# Patient Record
Sex: Female | Born: 1975 | Race: Black or African American | Hispanic: No | Marital: Married | Smoking: Never smoker
Health system: Southern US, Community
[De-identification: ages and names within clinical notes are randomized; demographics above are authoritative.]

## PROBLEM LIST (undated history)

## (undated) DIAGNOSIS — D649 Anemia, unspecified: Secondary | ICD-10-CM

## (undated) DIAGNOSIS — J45909 Unspecified asthma, uncomplicated: Secondary | ICD-10-CM

## (undated) DIAGNOSIS — R87629 Unspecified abnormal cytological findings in specimens from vagina: Secondary | ICD-10-CM

## (undated) DIAGNOSIS — G43909 Migraine, unspecified, not intractable, without status migrainosus: Secondary | ICD-10-CM

## (undated) DIAGNOSIS — IMO0001 Reserved for inherently not codable concepts without codable children: Secondary | ICD-10-CM

## (undated) DIAGNOSIS — E119 Type 2 diabetes mellitus without complications: Secondary | ICD-10-CM

## (undated) DIAGNOSIS — Z531 Procedure and treatment not carried out because of patient's decision for reasons of belief and group pressure: Secondary | ICD-10-CM

## (undated) HISTORY — DX: Anemia, unspecified: D64.9

## (undated) HISTORY — DX: Migraine, unspecified, not intractable, without status migrainosus: G43.909

## (undated) HISTORY — DX: Unspecified abnormal cytological findings in specimens from vagina: R87.629

## (undated) HISTORY — DX: Unspecified asthma, uncomplicated: J45.909

## (undated) HISTORY — PX: TUBAL LIGATION: SHX77

---

## 2016-11-23 ENCOUNTER — Encounter (HOSPITAL_COMMUNITY): Payer: Self-pay

## 2016-11-23 ENCOUNTER — Emergency Department (HOSPITAL_COMMUNITY)
Admission: EM | Admit: 2016-11-23 | Discharge: 2016-11-23 | Disposition: A | Discharge status: Home / Self Care | Payer: Medicaid Other | Attending: Emergency Medicine | Admitting: Emergency Medicine

## 2016-11-23 DIAGNOSIS — G8929 Other chronic pain: Secondary | ICD-10-CM | POA: Insufficient documentation

## 2016-11-23 DIAGNOSIS — M542 Cervicalgia: Secondary | ICD-10-CM | POA: Diagnosis not present

## 2016-11-23 MED ORDER — PREDNISONE 20 MG PO TABS
60.0000 mg | ORAL_TABLET | Freq: Once | ORAL | Status: AC
  Administered 2016-11-23: 60 mg via ORAL
  Filled 2016-11-23: qty 3

## 2016-11-23 MED ORDER — LIDOCAINE 4 % EX CREA: 1.0000 "application " | TOPICAL_CREAM | CUTANEOUS | 0 refills | Status: DC | PRN

## 2016-11-23 MED ORDER — PREDNISONE 50 MG PO TABS: ORAL_TABLET | ORAL | 0 refills | Status: DC

## 2016-11-23 NOTE — ED Triage Notes (Signed)
Patient here with reported chronic right sided neck pain that she has been seen in [place] for several times. States that it burns if she turns head, denies trauma.

## 2016-11-23 NOTE — ED Notes (Signed)
Papers reviewed with patient and she verbalizes understanding.  

## 2016-11-23 NOTE — ED Provider Notes (Signed)
[facility] Provider Note   CSN: [id number] Arrival date & time: 11/23/16  1050     History   Chief Complaint Neck pain  HPI   Blood pressure 118/78, pulse 66, temperature 99.1 F (37.3 C), temperature source Oral, resp. rate 16, SpO2 100 %.  Emily Glenn is a 41 y.o. female complaining of Bilateral neck pain onset greater than 6 months ago, states that she has a burning sensation that radiates down the bilateral back into the gluteus, states she's been seen for this multiple timing given prescription for muscle relaxers which she has not found to be helpful. She denies any numbness, weakness, difficulty gripping or dropping objects. Is been taking Tylenol at home with little relief. She has an appoint with primary care in April.  History reviewed. No pertinent past medical history.  There are no active problems to display for this patient.   History reviewed. No pertinent surgical history.  OB History    No data available       Home Medications    Prior to Admission medications   Medication Sig Start Date End Date Taking? Authorizing Provider  lidocaine (LMX) 4 % cream Apply 1 application topically as needed. 11/23/16   Cashis Rill, PA-C  predniSONE (DELTASONE) 50 MG tablet Take 1 tablet daily with breakfast 11/23/16   Wynetta Emery, PA-C    Family History No family history on file.  Social History Social History  Substance Use Topics  . Smoking status: Not on file  . Smokeless tobacco: Not on file  . Alcohol use Not on file     Allergies   Patient has no known allergies.   Review of Systems Review of Systems  10 systems reviewed and found to be negative, except as noted in the HPI.   Physical Exam Updated Vital Signs BP 126/86   Pulse 66   Temp 99.1 F (37.3 C) (Oral)   Resp 16   SpO2 100%   Physical Exam  Constitutional: She is oriented to person, place, and time. She appears well-developed and well-nourished. No distress.    HENT:  Head: Normocephalic and atraumatic.  Mouth/Throat: Oropharynx is clear and moist.  Eyes: Conjunctivae and EOM are normal. Pupils are equal, round, and reactive to light.  Neck: Normal range of motion.  Spurling test is positive bilaterally, grip strength is 5 out of 5 bilaterally. Sensation intact to pinprick and light touch.  Cardiovascular: Normal rate, regular rhythm and intact distal pulses.   Pulmonary/Chest: Effort normal and breath sounds normal.  Abdominal: Soft. There is no tenderness.  Musculoskeletal: Normal range of motion.  Neurological: She is alert and oriented to person, place, and time.  Skin: She is not diaphoretic.  Psychiatric: She has a normal mood and affect.  Nursing note and vitals reviewed.    ED Treatments / Results  Labs (all labs ordered are listed, but only abnormal results are displayed) Labs Reviewed - No data to display  EKG  EKG Interpretation None       Radiology No results found.  Procedures Procedures (including critical care time)  Medications Ordered in ED Medications  predniSONE (DELTASONE) tablet 60 mg (not administered)     Initial Impression / Assessment and Plan / ED Course  I have reviewed the triage vital signs and the nursing notes.  Pertinent labs & imaging results that were available during my care of the patient were reviewed by me and considered in my medical decision making (see chart for details).  Vitals:   11/23/16 1101 11/23/16 1205  BP: 118/78 126/86  Pulse: 66   Resp: 16   Temp: 99.1 F (37.3 C)   TempSrc: Oral   SpO2: 100%     Medications  predniSONE (DELTASONE) tablet 60 mg (not administered)    Emily Glenn is 41 y.o. female presenting with Chronic neck pain greater than 6 months, burning paresthesia down the bilateral back into the gluteus. Neurologic exam is nonfocal, Spurling test positive bilaterally, likely cervical radiculopathy, patient given prednisone burst and lidocaine  ointment for comfort, advised to follow closely with primary care. No signs of cord compression. No indication for emergent imaging or neurosurgery consult.  Evaluation does not show pathology that would require ongoing emergent intervention or inpatient treatment. Pt is hemodynamically stable and mentating appropriately. Discussed findings and plan with patient/guardian, who agrees with care plan. All questions answered. Return precautions discussed and outpatient follow up given.      Final Clinical Impressions(s) / ED Diagnoses   Final diagnoses:  Chronic neck pain    New Prescriptions New Prescriptions   LIDOCAINE (LMX) 4 % CREAM    Apply 1 application topically as needed.   PREDNISONE (DELTASONE) 50 MG TABLET    Take 1 tablet daily with breakfast     Wynetta Emeryicole Boyd Litaker, PA-C 11/23/16 1233    Alvira MondayErin Schlossman, MD 11/26/16 1704

## 2016-11-23 NOTE — Discharge Instructions (Signed)
Please follow with your primary care doctor in the next 2 days for a check-up. They must obtain records for further management.  ° °Do not hesitate to return to the Emergency Department for any new, worsening or concerning symptoms.  ° °

## 2016-11-25 ENCOUNTER — Encounter (HOSPITAL_COMMUNITY): Payer: Self-pay | Admitting: *Deleted

## 2016-11-25 ENCOUNTER — Ambulatory Visit (HOSPITAL_COMMUNITY)
Admission: EM | Admit: 2016-11-25 | Discharge: 2016-11-25 | Disposition: A | Discharge status: Home / Self Care | Payer: Medicaid Other | Attending: Emergency Medicine | Admitting: Emergency Medicine

## 2016-11-25 DIAGNOSIS — J111 Influenza due to unidentified influenza virus with other respiratory manifestations: Secondary | ICD-10-CM | POA: Diagnosis not present

## 2016-11-25 MED ORDER — BENZONATATE 100 MG PO CAPS: 100.0000 mg | ORAL_CAPSULE | Freq: Three times a day (TID) | ORAL | 0 refills | Status: DC

## 2016-11-25 MED ORDER — ONDANSETRON HCL 4 MG PO TABS: 4.0000 mg | ORAL_TABLET | Freq: Three times a day (TID) | ORAL | 0 refills | Status: DC | PRN

## 2016-11-25 MED ORDER — OSELTAMIVIR PHOSPHATE 75 MG PO CAPS: 75.0000 mg | ORAL_CAPSULE | Freq: Two times a day (BID) | ORAL | 0 refills | Status: DC

## 2016-11-25 NOTE — ED Triage Notes (Signed)
Pt  Reports   Symptoms  Of  Body  Aches    Chills           sorethroat        Since  Yesterday

## 2016-11-25 NOTE — ED Provider Notes (Signed)
[facility]    CSN: [id number] Arrival date & time: 11/25/16  1855     History   Chief Complaint Chief Complaint  Patient presents with  . Fever    HPI Emily Glenn is a 41 y.o. female.   HPI She is a 41 year old woman here for evaluation of body aches, sore throat, and cough. Symptoms started abruptly yesterday morning. She reports chills, but no documented fever. She reports headache and nasal congestion as well as nausea and vomiting. She was seen in the emergency room just prior to symptom onset for neck pain and stiffness. She is taking prednisone for that.  History reviewed. No pertinent past medical history.  There are no active problems to display for this patient.   History reviewed. No pertinent surgical history.  OB History    No data available       Home Medications    Prior to Admission medications   Medication Sig Start Date End Date Taking? Authorizing Provider  benzonatate (TESSALON) 100 MG capsule Take 1 capsule (100 mg total) by mouth every 8 (eight) hours. 11/25/16   Charm RingsErin J Jadon Ressler, MD  lidocaine (LMX) 4 % cream Apply 1 application topically as needed. 11/23/16   Nicole Pisciotta, PA-C  ondansetron (ZOFRAN) 4 MG tablet Take 1 tablet (4 mg total) by mouth every 8 (eight) hours as needed for nausea or vomiting. 11/25/16   Charm RingsErin J Toussaint Golson, MD  oseltamivir (TAMIFLU) 75 MG capsule Take 1 capsule (75 mg total) by mouth every 12 (twelve) hours. 11/25/16   Charm RingsErin J Rickie Gange, MD  predniSONE (DELTASONE) 50 MG tablet Take 1 tablet daily with breakfast 11/23/16   Wynetta EmeryNicole Pisciotta, PA-C    Family History History reviewed. No pertinent family history.  Social History Social History  Substance Use Topics  . Smoking status: Not on file  . Smokeless tobacco: Not on file  . Alcohol use Not on file     Allergies   Patient has no known allergies.   Review of Systems Review of Systems As in history of present illness  Physical Exam Triage Vital Signs ED  Triage Vitals  Enc Vitals Group     BP 11/25/16 1953 129/89     Pulse Rate 11/25/16 1953 93     Resp 11/25/16 1953 17     Temp 11/25/16 1953 100 F (37.8 C)     Temp Source 11/25/16 1953 Oral     SpO2 11/25/16 1953 100 %     Weight --      Height --      Head Circumference --      Peak Flow --      Pain Score 11/25/16 2004 10     Pain Loc --      Pain Edu? --      Excl. in GC? --    No data found.   Updated Vital Signs BP 129/89 (BP Location: Left Arm)   Pulse 93   Temp 100 F (37.8 C) (Oral)   Resp 17   LMP 11/25/2016   SpO2 100%   Visual Acuity Right Eye Distance:   Left Eye Distance:   Bilateral Distance:    Right Eye Near:   Left Eye Near:    Bilateral Near:     Physical Exam  Constitutional: She is oriented to person, place, and time. She appears well-developed and well-nourished. No distress.  Appears ill  HENT:  Mouth/Throat: No oropharyngeal exudate.  Oropharynx is erythematous. Nasal mucosa mildly erythematous.  Neck: Neck supple.  Cardiovascular: Normal rate, regular rhythm and normal heart sounds.   No murmur heard. Pulmonary/Chest: Effort normal and breath sounds normal. No respiratory distress. She has no wheezes. She has no rales.  Lymphadenopathy:    She has no cervical adenopathy.  Neurological: She is alert and oriented to person, place, and time.     UC Treatments / Results  Labs (all labs ordered are listed, but only abnormal results are displayed) Labs Reviewed - No data to display  EKG  EKG Interpretation None       Radiology No results found.  Procedures Procedures (including critical care time)  Medications Ordered in UC Medications - No data to display   Initial Impression / Assessment and Plan / UC Course  I have reviewed the triage vital signs and the nursing notes.  Pertinent labs & imaging results that were available during my care of the patient were reviewed by me and considered in my medical decision making  (see chart for details).     Likely flu. Given the symptoms started 36 hours ago, will place on Tamiflu. Tessalon and Zofran for symptomatic relief. Work note provided. Follow-up as needed.  Final Clinical Impressions(s) / UC Diagnoses   Final diagnoses:  Influenza    New Prescriptions New Prescriptions   BENZONATATE (TESSALON) 100 MG CAPSULE    Take 1 capsule (100 mg total) by mouth every 8 (eight) hours.   ONDANSETRON (ZOFRAN) 4 MG TABLET    Take 1 tablet (4 mg total) by mouth every 8 (eight) hours as needed for nausea or vomiting.   OSELTAMIVIR (TAMIFLU) 75 MG CAPSULE    Take 1 capsule (75 mg total) by mouth every 12 (twelve) hours.     Charm Rings, MD 11/25/16 2022

## 2016-11-25 NOTE — Discharge Instructions (Signed)
You have the flu. Take Tamiflu twice a day for 5 days. This does not cure the flu, but shortens duration of symptoms. Use Tessalon every 8 hours as needed for cough. Use Zofran every 8 hours as needed for nausea or vomiting. Get plenty of rest and drink lots of fluids. Alternate Tylenol and ibuprofen to help with body aches and fever. You're going to feel pretty miserable for another few days before things start to get better.

## 2017-02-04 ENCOUNTER — Ambulatory Visit: Payer: Medicaid Other | Attending: Family Medicine | Admitting: Physical Therapy

## 2017-02-04 ENCOUNTER — Encounter: Payer: Self-pay | Admitting: Physical Therapy

## 2017-02-04 DIAGNOSIS — M5442 Lumbago with sciatica, left side: Secondary | ICD-10-CM | POA: Diagnosis present

## 2017-02-04 DIAGNOSIS — M62838 Other muscle spasm: Secondary | ICD-10-CM | POA: Diagnosis present

## 2017-02-04 DIAGNOSIS — M542 Cervicalgia: Secondary | ICD-10-CM | POA: Diagnosis not present

## 2017-02-04 NOTE — Therapy (Addendum)
[facility] [address] [place], [place], [address] Phone: [phone]   Fax:  [phone]  Physical Therapy Evaluation  Patient Details  Name: Emily Glenn MRN: [id number] Date of Birth: August 31, 1976 Referring Provider: Zoe Lan  Encounter Date: 02/04/2017      PT End of Session - 02/04/17 1542    Visit Number 1   Date for PT Re-Evaluation 04/06/17   PT Start Time 1516   PT Stop Time 1610   PT Time Calculation (min) 54 min   Activity Tolerance Patient tolerated treatment well   Behavior During Therapy [facility] for tasks assessed/performed      History reviewed. No pertinent past medical history.  History reviewed. No pertinent surgical history.  There were no vitals filed for this visit.       Subjective Assessment - 02/04/17 1516    Subjective Patient presents with neck and left sciatic pain.  She reports that she has had some pain for over a year.  She reports that she has been diagnosed with scoliosis, she reports that an MD in the past has spoken with her about breast reduction surgery due to the neck, upper back and HA's.  Reports that she has HA's daily.  She does have the left side sciatica.  X-rays otherwise were negative.   Limitations Sitting;Lifting;Walking;Standing;House hold activities   Patient Stated Goals have less pain and HA's   Currently in Pain? Yes   Pain Score 7    Pain Location Back   Pain Orientation Upper;Mid;Lower   Pain Descriptors / Indicators Aching;Tightness;Spasm   Pain Type Chronic pain   Pain Onset More than a month ago   Pain Frequency Constant   Aggravating Factors  bending and lifting, worse as the day goes on, up to 9-10/10   Pain Relieving Factors staying moving, heat, at best 2/10   Effect of Pain on Daily Activities just miserable at times, has a HA daily            [facility] PT Assessment - 02/04/17 0001      Assessment   Medical Diagnosis neck pain, HA and sciatica    Referring Provider Zoe Lan   Onset Date/Surgical Date 01/04/17   Hand Dominance Right   Prior Therapy none     Precautions   Precautions None     Balance Screen   Has the patient fallen in the past 6 months No   Has the patient had a decrease in activity level because of a fear of falling?  No   Is the patient reluctant to leave their home because of a fear of falling?  No     Home Environment   Additional Comments patient has a 69 and 42 year old.  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   Leisure no exercise     Sensation   Additional Comments does c/o tingling in the hands and feet     Posture/Postural Control   Posture Comments large busted, straight back posture     ROM / Strength   AROM / PROM / Strength AROM;Strength     AROM   Overall AROM Comments Lumbar ROM was decreased 75% for all motions with pain, cervcial ROM decreased 50% for flexion, extension and side bending, rotaiton WNL's, this increased pain in the neck, Shoulder AROM  flexion/abduction to 100 degrees, ER and IR WNL's     Strength   Overall Strength Comments 4-/5 in the shoulders and Hips with c/o increased pain     Flexibility   Soft Tissue Assessment /Muscle Length --  has neural tension in the UE and LE's     Palpation   Palpation comment has tightness iwth spasms in the upper trap and the cervical and lumbar parapsinals, tender in the left SI and buttock                   [facility] Adult PT Treatment/Exercise - 02/04/17 0001      Modalities   Modalities Electrical Stimulation;Moist Heat     Moist Heat Therapy   Number Minutes Moist Heat 15 Minutes   Moist Heat Location Cervical;Lumbar Spine     Electrical Stimulation   Electrical Stimulation Location left SI/buttock   Electrical Stimulation Action IFC   Electrical Stimulation Parameters supine    Electrical Stimulation Goals Pain                PT Education - 02/04/17 1542    Education provided Yes   Education Details gentle cervical motions, UE and LE stretches   Person(s) Educated Patient   Methods Explanation;Demonstration   Comprehension Verbalized understanding          PT Short Term Goals - 02/04/17 1546      PT SHORT TERM GOAL #1   Title independent with initial HEP   Time 2   Period Weeks   Status New           PT Long Term Goals - 02/04/17 1546      PT LONG TERM GOAL #1   Title understand proper posture and body mechanics   Time 8   Period Weeks   Status New     PT LONG TERM GOAL #2   Title decrease pain 50%   Time 8   Period Weeks   Status New     PT LONG TERM GOAL #3   Title increase ROM 50%   Time 8   Period Weeks   Status New     PT LONG TERM GOAL #4   Title report HA frequency decreased 25%   Time 8   Period Weeks   Status New               Plan - 02/04/17 1543    Clinical Impression Statement Patient reports a history of neck pain, HA's and some left sciatica greater than a year.  She mentioned having large breasts and that an MD in the past had recommended breast reduction surgery, she reports that she has been diagnosed with scoliosis.  She has tenderness in the left SI/buttock, she has neural tension in the UE and LE's.  She has lumbar ROM that is limited 50%   Rehab Potential Good   PT Frequency 2x / week   PT Duration 8 weeks   PT Treatment/Interventions ADLs/Self Care Home Management;Cryotherapy;Electrical Stimulation;Moist Heat;Traction;Ultrasound;Patient/family education;Neuromuscular re-education;Balance training;Therapeutic exercise;Therapeutic activities;Manual techniques;Taping   PT Next Visit Plan get patient moving in the gym and talk about proper posture and body mechanics with her job   Consulted and Agree with Plan of Care Patient      Patient will benefit from skilled therapeutic intervention  in order to improve the following deficits and impairments:  Decreased strength, Postural dysfunction, Improper body mechanics, Impaired flexibility, Pain, Increased muscle spasms, Decreased range of motion, Difficulty walking  Visit Diagnosis: Cervicalgia - Plan: PT plan of care cert/re-cert  Other muscle spasm - Plan: PT plan of care cert/re-cert  Acute left-sided low back pain with left-sided sciatica - Plan: PT plan of care cert/re-cert  Patient only has Medicaid which only allows one evaluation and one visit per year.  Next visit would need to give thorough HEP, I gave TENS info, if she were to get this would need to instruct in in its use   Problem List There are no active problems to display for this patient.   Jearld Lesch., PT 02/04/2017, 3:49 PM  [facility] [address] [place], [place], [address] Phone: [phone]   Fax:  [phone]  Name: Emily Glenn MRN: [id number] Date of Birth: [birth year]

## 2017-02-19 ENCOUNTER — Encounter: Payer: Self-pay | Admitting: Physical Therapy

## 2017-02-19 ENCOUNTER — Ambulatory Visit: Payer: Medicaid Other | Admitting: Physical Therapy

## 2017-02-19 DIAGNOSIS — M62838 Other muscle spasm: Secondary | ICD-10-CM

## 2017-02-19 DIAGNOSIS — M5442 Lumbago with sciatica, left side: Secondary | ICD-10-CM

## 2017-02-19 DIAGNOSIS — M542 Cervicalgia: Secondary | ICD-10-CM

## 2017-02-19 NOTE — Therapy (Addendum)
[facility] [address] [place], [place], [address] Phone: [phone]   Fax:  [phone]  Physical Therapy Treatment  Patient Details  Name: Emily Glenn MRN: [id number] Date of Birth: 05/17/1976 Referring Provider: Eldridge Abrahams  Encounter Date: 02/19/2017      PT End of Session - 02/19/17 1649    Visit Number 2   Date for PT Re-Evaluation 04/06/17   PT Start Time 1224   PT Stop Time 1658   PT Time Calculation (min) 57 min   Activity Tolerance Patient tolerated treatment well   Behavior During Therapy [facility] for tasks assessed/performed      History reviewed. No pertinent past medical history.  History reviewed. No pertinent surgical history.  There were no vitals filed for this visit.      Subjective Assessment - 02/19/17 1602    Subjective Pt. states that she is having neck and back pain today rated at an 8. Stated that she almost went to the emergency room last night and right before todays appointment due to pain. Reported that pain last night was right side low back. Pt stated that she took some pain meds this afternoon. Reports that she has headaches every day.    Limitations Sitting;Lifting;Walking;Standing;House hold activities   Patient Stated Goals have less pain and HA's   Pain Score 8    Pain Location Back  neck                         [facility] Adult PT Treatment/Exercise - 02/19/17 0001      Exercises   Exercises Neck;Lumbar     Neck Exercises: Seated   Shoulder Shrugs 10 reps  2 sets, no weight    Other Seated Exercise Shoulder ER, [place]ulder horizontal abduction 2x10 with red theraband      Lumbar Exercises: Stretches   Passive Hamstring Stretch 3 reps;20 seconds   Lower Trunk Rotation 3 reps;20 seconds     Lumbar Exercises: Aerobic   UBE (Upper Arm Bike) [id number]     Lumbar Exercises: Standing   Row 10 reps  2 sets   Theraband Level (Row) Level 2 (Red)   Shoulder Extension 10 reps  2 sets   Theraband Level (Shoulder Extension) Level 2 (Red)   Other Standing Lumbar Exercises standing back extension 2x10 with Bink ball, standing trunk rotation with red ball 2x10   Other Standing Lumbar Exercises calf stretch 3x20 secs     Lumbar Exercises: Supine   Bridge 10 reps;2 seconds   Bridge Limitations 2 sets     Modalities   Modalities Electrical Stimulation;Moist Heat     Moist Heat Therapy   Number Minutes Moist Heat 15 Minutes   Moist Heat Location Cervical;Lumbar Spine     Electrical Stimulation   Electrical Stimulation Location left SI/buttock   Electrical Stimulation Action IFC    Electrical Stimulation Parameters supine   Electrical Stimulation Goals Pain                  PT Short Term Goals - 02/04/17 1546      PT SHORT TERM GOAL #1   Title independent with initial HEP   Time 2   Period Weeks   Status New           PT Long Term Goals - 02/04/17 1546      PT LONG TERM GOAL #1   Title understand proper posture and  body mechanics   Time 8   Period Weeks   Status New     PT LONG TERM GOAL #2   Title decrease pain 50%   Time 8   Period Weeks   Status New     PT LONG TERM GOAL #3   Title increase ROM 50%   Time 8   Period Weeks   Status New     PT LONG TERM GOAL #4   Title report HA frequency decreased 25%   Time 8   Period Weeks   Status New               Plan - 02/19/17 1650    Clinical Impression Statement Pt was able to complete all exercises today. No complaints of increased pain or tightness in back with exercises. Tolerated stretches but has more tightness in L HS compared to R. Complaints of increased tightness in neck during rows/shoulder extensions/horizontal shoulder abduction. 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 Pt reported decreased pain after treatment today. Issued HEP to pt.    Rehab Potential Good   PT Frequency 2x / week    PT Duration 8 weeks   PT Treatment/Interventions ADLs/Self Care Home Management;Cryotherapy;Electrical Stimulation;Moist Heat;Traction;Ultrasound;Patient/family education;Neuromuscular re-education;Balance training;Therapeutic exercise;Therapeutic activities;Manual techniques;Taping   PT Next Visit Plan DC pt due to number of visits   Consulted and Agree with Plan of Care Patient      Patient will benefit from skilled therapeutic intervention in order to improve the following deficits and impairments:  Decreased strength, Postural dysfunction, Improper body mechanics, Impaired flexibility, Pain, Increased muscle spasms, Decreased range of motion, Difficulty walking  Visit Diagnosis: Cervicalgia  Other muscle spasm  Acute left-sided low back pain with left-sided sciatica     Problem List There are no active problems to display for this patient.  PHYSICAL THERAPY DISCHARGE SUMMARY  Visits from Start of Care: 2  Plan: Patient agrees to discharge.  Patient goals were not met. Patient is being discharged due to not returning since the last visit.  ?????      Octavia Bruckner 02/19/2017, 4:58 PM  [facility] [address] [place], [place], [address] Phone: [phone]   Fax:  [phone]  Name: Emily Glenn MRN: [id number] Date of Birth: [birth year]

## 2017-11-13 ENCOUNTER — Emergency Department (HOSPITAL_COMMUNITY)
Admission: EM | Admit: 2017-11-13 | Discharge: 2017-11-13 | Disposition: A | Discharge status: Home / Self Care | Payer: Medicaid Other | Attending: Emergency Medicine | Admitting: Emergency Medicine

## 2017-11-13 ENCOUNTER — Encounter (HOSPITAL_COMMUNITY): Payer: Self-pay

## 2017-11-13 ENCOUNTER — Emergency Department (HOSPITAL_COMMUNITY): Payer: Medicaid Other

## 2017-11-13 ENCOUNTER — Other Ambulatory Visit: Payer: Self-pay

## 2017-11-13 DIAGNOSIS — E119 Type 2 diabetes mellitus without complications: Secondary | ICD-10-CM | POA: Diagnosis not present

## 2017-11-13 DIAGNOSIS — R05 Cough: Secondary | ICD-10-CM | POA: Diagnosis present

## 2017-11-13 DIAGNOSIS — B9789 Other viral agents as the cause of diseases classified elsewhere: Secondary | ICD-10-CM | POA: Insufficient documentation

## 2017-11-13 DIAGNOSIS — R059 Cough, unspecified: Secondary | ICD-10-CM

## 2017-11-13 DIAGNOSIS — J069 Acute upper respiratory infection, unspecified: Secondary | ICD-10-CM | POA: Diagnosis not present

## 2017-11-13 HISTORY — DX: Reserved for inherently not codable concepts without codable children: IMO0001

## 2017-11-13 HISTORY — DX: Procedure and treatment not carried out because of patient's decision for reasons of belief and group pressure: Z53.1

## 2017-11-13 LAB — RAPID STREP SCREEN (MED CTR MEBANE ONLY): STREPTOCOCCUS, GROUP A SCREEN (DIRECT): NEGATIVE

## 2017-11-13 MED ORDER — IBUPROFEN 800 MG PO TABS
800.0000 mg | ORAL_TABLET | Freq: Once | ORAL | Status: AC
Start: 2017-11-13 — End: 2017-11-13
  Administered 2017-11-13: 800 mg via ORAL
  Filled 2017-11-13: qty 1

## 2017-11-13 NOTE — ED Provider Notes (Signed)
[facility] EMERGENCY DEPARTMENT Provider Note   CSN: [id number] Arrival date & time: 11/13/17  1413     History   Chief Complaint Chief Complaint  Patient presents with  . URI    HPI Emily Glenn is a 42 y.o. female.  The history is provided by the patient and medical records. No language interpreter was used.  URI   Associated symptoms include congestion, sinus pain, sore throat and cough. Pertinent negatives include no chest pain, no abdominal pain, no diarrhea, no nausea, no vomiting, no headaches and no rash.     Emily Glenn is a 42 y.o. female  with a PMH of DM who presents to the Emergency Department complaining of progressively worsening sore throat, facial pain, generalized body aches, productive cough and chills which began yesterday.  She endorses feeling feverish, but has not taken temperature. She has taken Tylenol with some improvement. No known sick contacts. Did not get flu shot.   Past Medical History:  Diagnosis Date  . Refusal of blood transfusions as patient is Jehovah's Witness     There are no active problems to display for this patient.   History reviewed. No pertinent surgical history.  OB History    No data available       Home Medications    Prior to Admission medications   Medication Sig Start Date End Date Taking? Authorizing Provider  benzonatate (TESSALON) 100 MG capsule Take 1 capsule (100 mg total) by mouth every 8 (eight) hours. 11/25/16   Charm Rings, MD  lidocaine (LMX) 4 % cream Apply 1 application topically as needed. 11/23/16   Pisciotta, Joni Reining, PA-C  ondansetron (ZOFRAN) 4 MG tablet Take 1 tablet (4 mg total) by mouth every 8 (eight) hours as needed for nausea or vomiting. 11/25/16   Charm Rings, MD  oseltamivir (TAMIFLU) 75 MG capsule Take 1 capsule (75 mg total) by mouth every 12 (twelve) hours. 11/25/16   Charm Rings, MD  predniSONE (DELTASONE) 50 MG tablet Take 1 tablet daily with breakfast 11/23/16    Pisciotta, Mardella Layman    Family History History reviewed. No pertinent family history.  Social History Social History   Tobacco Use  . Smoking status: Never Smoker  . Smokeless tobacco: Never Used  Substance Use Topics  . Alcohol use: No    Frequency: Never  . Drug use: No     Allergies   Patient has no known allergies.   Review of Systems Review of Systems  Constitutional: Positive for chills. Negative for fever.  HENT: Positive for congestion, sinus pressure, sinus pain and sore throat.   Respiratory: Positive for cough. Negative for shortness of breath.   Cardiovascular: Negative for chest pain.  Gastrointestinal: Negative for abdominal pain, diarrhea, nausea and vomiting.  Musculoskeletal: Positive for myalgias.  Skin: Negative for rash.  Neurological: Negative for weakness, light-headedness and headaches.     Physical Exam Updated Vital Signs BP 116/83 (BP Location: Right Arm)   Pulse 86   Temp 98.4 F (36.9 C) (Oral)   Resp 16   Ht 5\' 5"  (1.651 m)   Wt 77.1 kg (170 lb)   LMP 11/11/2017 (Exact Date)   SpO2 100%   BMI 28.29 kg/m   Physical Exam  Constitutional: She is oriented to person, place, and time. She appears well-developed and well-nourished. No distress.  HENT:  Head: Normocephalic and atraumatic.  OP with erythema, no exudates or tonsillar hypertrophy. + nasal congestion with mucosal edema.  Neck: Normal range of motion. Neck supple.  No meningeal signs.   Cardiovascular: Normal rate, regular rhythm and normal heart sounds.  Pulmonary/Chest: Effort normal.  Lungs are clear to auscultation bilaterally - no w/r/r  Abdominal: Soft. She exhibits no distension. There is no tenderness.  Musculoskeletal: Normal range of motion.  Neurological: She is alert and oriented to person, place, and time.  Skin: Skin is warm and dry. She is not diaphoretic.  Nursing note and vitals reviewed.    ED Treatments / Results  Labs (all labs ordered are  listed, but only abnormal results are displayed) Labs Reviewed  RAPID STRE[facility]CREEN ([facility])  CULTURE, GROUP A STREP [facility])    EKG  EKG Interpretation None       Radiology Dg Chest 2 View  Result Date: 11/13/2017 CLINICAL DATA:  Productive cough and shortness of breath for 1 day. EXAM: CHEST  2 VIEW COMPARISON:  None. FINDINGS: The heart size and mediastinal contours are within normal limits. Both lungs are clear. The visualized skeletal structures are unremarkable. IMPRESSION: No active cardiopulmonary disease. Electronically Signed   By: Sherian ReinWei-Chen  Lin M.D.   On: 11/13/2017 18:32    Procedures Procedures (including critical care time)  Medications Ordered in ED Medications  ibuprofen (ADVIL,MOTRIN) tablet 800 mg (800 mg Oral Given 11/13/17 1806)     Initial Impression / Assessment and Plan / ED Course  I have reviewed the triage vital signs and the nursing notes.  Pertinent labs & imaging results that were available during my care of the patient were reviewed by me and considered in my medical decision making (see chart for details).    Nila Nephewracy Lasecki is a 42 y.o. female who presents to ED for multiple upper respiratory symptoms including productive cough, sore throat, sinus pressure, generalized body aches, chills.   On exam, patient is afebrile, non-toxic appearing with a clear lung exam. Mild rhinorrhea and OP with erythema but no exudates or tonsillar hypertrophy.  CXR negative. Rapid strep negative.  Sxs today likely due to viral URI, flu possible. Symptomatic home care instructions discussed.  PCP follow up strongly encouraged if symptoms persist. Reasons to return to ER discussed. All questions answered.   Blood pressure 116/83, pulse 86, temperature 98.4 F (36.9 C), temperature source Oral, resp. rate 16, height 5\' 5"  (1.651 m), weight 77.1 kg (170 lb), last menstrual period 11/11/2017, SpO2 100 %.   Final Clinical Impressions(s) / ED Diagnoses   Final  diagnoses:  Cough  Viral URI with cough    ED Discharge Orders    None       Ward, Chase PicketJaime Pilcher, PA-C 11/13/17 1845    Eber HongMiller, Brian, MD 11/14/17 1453

## 2017-11-13 NOTE — Discharge Instructions (Signed)
It was my pleasure taking care of you today!   Your symptoms are likely due to a viral upper respiratory infection. Fortunately, we did not see evidence of serious infection and can treat your symptoms. Flonase and mucinex for nasal congestion. Alternate between Tylenol and ibuprofen as needed for body aches / fevers.   Rest, drink plenty of fluids to be sure you are staying hydrated.   Please follow up with your primary doctor for discussion of your diagnoses and further evaluation after today's visit if symptoms persist longer than 7 days; Return to the ER for high fevers, difficulty breathing or other concerning symptoms

## 2017-11-13 NOTE — ED Notes (Signed)
Patient transported to X-ray 

## 2017-11-13 NOTE — ED Triage Notes (Signed)
Pt states she has had nasal congestion, facial pain, and sore throat that began last night. States she was running a fever at home but was able to use medication. Afebrile in triage, vital signs stable.

## 2017-11-15 LAB — CULTURE, GROUP A STREP (THRC)

## 2018-04-19 DIAGNOSIS — M25512 Pain in left shoulder: Secondary | ICD-10-CM | POA: Diagnosis present

## 2018-04-19 DIAGNOSIS — M7542 Impingement syndrome of left shoulder: Secondary | ICD-10-CM | POA: Insufficient documentation

## 2018-04-20 ENCOUNTER — Other Ambulatory Visit: Payer: Self-pay

## 2018-04-20 ENCOUNTER — Emergency Department (HOSPITAL_COMMUNITY)
Admission: EM | Admit: 2018-04-20 | Discharge: 2018-04-20 | Disposition: A | Discharge status: Home / Self Care | Payer: Medicaid Other | Attending: Emergency Medicine | Admitting: Emergency Medicine

## 2018-04-20 ENCOUNTER — Encounter (HOSPITAL_COMMUNITY): Payer: Self-pay | Admitting: Emergency Medicine

## 2018-04-20 ENCOUNTER — Emergency Department (HOSPITAL_COMMUNITY): Payer: Medicaid Other

## 2018-04-20 DIAGNOSIS — M7542 Impingement syndrome of left shoulder: Secondary | ICD-10-CM

## 2018-04-20 MED ORDER — IBUPROFEN 800 MG PO TABS: 800.0000 mg | ORAL_TABLET | Freq: Three times a day (TID) | ORAL | 0 refills | Status: DC

## 2018-04-20 NOTE — ED Provider Notes (Signed)
[facility]-EMERGENCY DEPT Provider Note   CSN: [id number] Arrival date & time: 04/19/18  2255     History   Chief Complaint Chief Complaint  Patient presents with  . Shoulder Pain    left    HPI Emily Glenn is a 42 y.o. female.  Patient presents to the emergency department with chief complaint of left shoulder pain.  She reports ongoing pain for the past couple of weeks.  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  She has increased pain with overhead movements especially.  She denies any trauma or known injury.  She has not taken anything for symptoms.  She denies numbness or weakness.  She states that the pain radiates down her left arm.  The history is provided by the patient. No language interpreter was used.    Past Medical History:  Diagnosis Date  . Refusal of blood transfusions as patient is Jehovah's Witness     There are no active problems to display for this patient.   History reviewed. No pertinent surgical history.   OB History   None      Home Medications    Prior to Admission medications   Medication Sig Start Date End Date Taking? Authorizing Provider  acetaminophen (TYLENOL) 500 MG tablet Take 500 mg by mouth every 6 (six) hours as needed for moderate pain.   Yes [provider]  benzonatate (TESSALON) 100 MG capsule Take 1 capsule (100 mg total) by mouth every 8 (eight) hours. Patient not taking: Reported on 04/19/2018 11/25/16   Charm Rings, MD  ibuprofen (ADVIL,MOTRIN) 800 MG tablet Take 1 tablet (800 mg total) by mouth 3 (three) times daily. 04/20/18   Roxy Horseman, PA-C  lidocaine (LMX) 4 % cream Apply 1 application topically as needed. Patient not taking: Reported on 04/19/2018 11/23/16   Pisciotta, Joni Reining, PA-C  ondansetron (ZOFRAN) 4 MG tablet Take 1 tablet (4 mg total) by mouth every 8 (eight) hours as needed for nausea or vomiting. Patient not taking: Reported on 04/19/2018 11/25/16   Charm Rings,  MD  oseltamivir (TAMIFLU) 75 MG capsule Take 1 capsule (75 mg total) by mouth every 12 (twelve) hours. Patient not taking: Reported on 04/19/2018 11/25/16   Charm Rings, MD  predniSONE (DELTASONE) 50 MG tablet Take 1 tablet daily with breakfast Patient not taking: Reported on 04/19/2018 11/23/16   Pisciotta, Joni Reining, PA-C    Family History No family history on file.  Social History Social History   Tobacco Use  . Smoking status: Never Smoker  . Smokeless tobacco: Never Used  Substance Use Topics  . Alcohol use: No    Frequency: Never  . Drug use: No     Allergies   Patient has no known allergies.   Review of Systems Review of Systems  All other systems reviewed and are negative.    Physical Exam Updated Vital Signs BP 104/74 (BP Location: Left Arm)   Pulse 78   Temp 98.3 F (36.8 C) (Oral)   Resp 15   Ht 5\' 5"  (1.651 m)   Wt 81.6 kg (180 lb)   LMP 03/30/2018   SpO2 99%   BMI 29.95 kg/m   Physical Exam  Constitutional: She is oriented to person, place, and time. She appears well-developed and well-nourished.  HENT:  Head: Normocephalic and atraumatic.  Eyes: Pupils are equal, round, and reactive to light. Conjunctivae and EOM are normal.  Neck: Normal range of motion. Neck supple.  Cardiovascular: Normal rate and regular rhythm. Exam reveals no gallop and no friction rub.  No murmur heard. Pulmonary/Chest: Effort normal and breath sounds normal. No respiratory distress. She has no wheezes. She has no rales. She exhibits no tenderness.  Abdominal: Soft. Bowel sounds are normal. She exhibits no distension and no mass. There is no tenderness. There is no rebound and no guarding.  Musculoskeletal: Normal range of motion. She exhibits no edema or tenderness.  Positive Hawkins-Kennedy, negative empty can No bony abnormality or deformity Range of motion and strength 5/5, but obviously painful with greater than 30 degrees abduction or flexion  Neurological: She is  alert and oriented to person, place, and time.  Skin: Skin is warm and dry.  Psychiatric: She has a normal mood and affect. Her behavior is normal. Judgment and thought content normal.  Nursing note and vitals reviewed.    ED Treatments / Results  Labs (all labs ordered are listed, but only abnormal results are displayed) Labs Reviewed - No data to display  EKG None  Radiology Dg Shoulder Left  Result Date: 04/20/2018 CLINICAL DATA:  Left shoulder pain x2 weeks. EXAM: LEFT SHOULDER - 2+ VIEW COMPARISON:  None. FINDINGS: There is no evidence of fracture or dislocation. There is no evidence of arthropathy or other focal bone abnormality. Soft tissues are unremarkable. IMPRESSION: No fracture or malalignment. Electronically Signed   By: Tollie Ethavid  Kwon M.D.   On: 04/20/2018 00:52    Procedures Procedures (including critical care time)  Medications Ordered in ED Medications - No data to display   Initial Impression / Assessment and Plan / ED Course  I have reviewed the triage vital signs and the nursing notes.  Pertinent labs & imaging results that were available during my care of the patient were reviewed by me and considered in my medical decision making (see chart for details).     Patient with left shoulder pain.  Plain films are negative.  Symptoms and physical exam are consistent with impingement syndrome.  Recommend orthopedic and physical therapy follow-up.  Final Clinical Impressions(s) / ED Diagnoses   Final diagnoses:  Impingement syndrome of left shoulder    ED Discharge Orders        Ordered    ibuprofen (ADVIL,MOTRIN) 800 MG tablet  3 times daily     04/20/18 0108       Roxy HorsemanBrowning, Arilynn Blakeney, PA-C 04/20/18 0111    Molpus, Jonny RuizJohn, MD 04/20/18 0236

## 2018-04-20 NOTE — ED Triage Notes (Signed)
Pt complaining of left shoulder pain with unknown origin x2 weeks. Pt states she does not remember injuring the shoulder at any time but that she woke up with sharp shooting pain a couple weeks ago and it has not gotten better.

## 2018-06-13 ENCOUNTER — Emergency Department (HOSPITAL_COMMUNITY)
Admission: EM | Admit: 2018-06-13 | Discharge: 2018-06-13 | Disposition: A | Discharge status: Home / Self Care | Payer: Medicaid Other | Attending: Emergency Medicine | Admitting: Emergency Medicine

## 2018-06-13 ENCOUNTER — Emergency Department (HOSPITAL_COMMUNITY): Payer: Medicaid Other

## 2018-06-13 ENCOUNTER — Encounter (HOSPITAL_COMMUNITY): Payer: Self-pay | Admitting: *Deleted

## 2018-06-13 DIAGNOSIS — J45909 Unspecified asthma, uncomplicated: Secondary | ICD-10-CM | POA: Insufficient documentation

## 2018-06-13 DIAGNOSIS — R112 Nausea with vomiting, unspecified: Secondary | ICD-10-CM

## 2018-06-13 DIAGNOSIS — R1084 Generalized abdominal pain: Secondary | ICD-10-CM

## 2018-06-13 DIAGNOSIS — K529 Noninfective gastroenteritis and colitis, unspecified: Secondary | ICD-10-CM | POA: Diagnosis not present

## 2018-06-13 DIAGNOSIS — R197 Diarrhea, unspecified: Secondary | ICD-10-CM

## 2018-06-13 LAB — URINALYSIS, ROUTINE W REFLEX MICROSCOPIC
BILIRUBIN URINE: NEGATIVE
GLUCOSE, UA: NEGATIVE mg/dL
Ketones, ur: NEGATIVE mg/dL
Nitrite: NEGATIVE
PH: 7 (ref 5.0–8.0)
PROTEIN: NEGATIVE mg/dL
SPECIFIC GRAVITY, URINE: 1.009 (ref 1.005–1.030)

## 2018-06-13 LAB — COMPREHENSIVE METABOLIC PANEL
ALT: 17 U/L (ref 0–44)
AST: 26 U/L (ref 15–41)
Albumin: 3.6 g/dL (ref 3.5–5.0)
Alkaline Phosphatase: 84 U/L (ref 38–126)
Anion gap: 8 (ref 5–15)
BUN: 10 mg/dL (ref 6–20)
CHLORIDE: 105 mmol/L (ref 98–111)
CO2: 27 mmol/L (ref 22–32)
CREATININE: 0.9 mg/dL (ref 0.44–1.00)
Calcium: 9.1 mg/dL (ref 8.9–10.3)
Glucose, Bld: 106 mg/dL — ABNORMAL HIGH (ref 70–99)
Potassium: 3.8 mmol/L (ref 3.5–5.1)
Sodium: 140 mmol/L (ref 135–145)
Total Bilirubin: 0.4 mg/dL (ref 0.3–1.2)
Total Protein: 7.5 g/dL (ref 6.5–8.1)

## 2018-06-13 LAB — CBC WITH DIFFERENTIAL/PLATELET
Basophils Absolute: 0 10*3/uL (ref 0.0–0.1)
Basophils Relative: 0 %
EOS PCT: 5 %
Eosinophils Absolute: 0.2 10*3/uL (ref 0.0–0.7)
HCT: 39 % (ref 36.0–46.0)
Hemoglobin: 12.5 g/dL (ref 12.0–15.0)
LYMPHS PCT: 38 %
Lymphs Abs: 1.7 10*3/uL (ref 0.7–4.0)
MCH: 27.7 pg (ref 26.0–34.0)
MCHC: 32.1 g/dL (ref 30.0–36.0)
MCV: 86.3 fL (ref 78.0–100.0)
MONO ABS: 0.7 10*3/uL (ref 0.1–1.0)
Monocytes Relative: 15 %
Neutro Abs: 1.9 10*3/uL (ref 1.7–7.7)
Neutrophils Relative %: 42 %
PLATELETS: 265 10*3/uL (ref 150–400)
RBC: 4.52 MIL/uL (ref 3.87–5.11)
RDW: 14.5 % (ref 11.5–15.5)
WBC: 4.6 10*3/uL (ref 4.0–10.5)

## 2018-06-13 LAB — I-STAT BETA HCG BLOOD, ED (MC, WL, AP ONLY)

## 2018-06-13 LAB — LIPASE, BLOOD: Lipase: 31 U/L (ref 11–51)

## 2018-06-13 MED ORDER — NAPROXEN 500 MG PO TABS: 500.0000 mg | ORAL_TABLET | Freq: Two times a day (BID) | ORAL | 0 refills | Status: DC | PRN

## 2018-06-13 MED ORDER — HYDROCODONE-ACETAMINOPHEN 5-325 MG PO TABS
1.0000 | ORAL_TABLET | Freq: Once | ORAL | Status: AC
  Administered 2018-06-13: 1 via ORAL
  Filled 2018-06-13: qty 1

## 2018-06-13 MED ORDER — ONDANSETRON HCL 4 MG/2ML IJ SOLN
4.0000 mg | Freq: Once | INTRAMUSCULAR | Status: AC
  Administered 2018-06-13: 4 mg via INTRAVENOUS
  Filled 2018-06-13: qty 2

## 2018-06-13 MED ORDER — SODIUM CHLORIDE 0.9 % IV BOLUS
1000.0000 mL | Freq: Once | INTRAVENOUS | Status: AC
  Administered 2018-06-13: 1000 mL via INTRAVENOUS

## 2018-06-13 MED ORDER — IOPAMIDOL (ISOVUE-300) INJECTION 61%
100.0000 mL | Freq: Once | INTRAVENOUS | Status: AC | PRN
  Administered 2018-06-13: 100 mL via INTRAVENOUS

## 2018-06-13 MED ORDER — MORPHINE SULFATE (PF) 4 MG/ML IV SOLN
4.0000 mg | Freq: Once | INTRAVENOUS | Status: AC
  Administered 2018-06-13: 4 mg via INTRAVENOUS
  Filled 2018-06-13: qty 1

## 2018-06-13 MED ORDER — DICYCLOMINE HCL 20 MG PO TABS: 20.0000 mg | ORAL_TABLET | Freq: Three times a day (TID) | ORAL | 0 refills | Status: DC | PRN

## 2018-06-13 MED ORDER — IOPAMIDOL (ISOVUE-300) INJECTION 61%
INTRAVENOUS | Status: AC
  Filled 2018-06-13: qty 100

## 2018-06-13 MED ORDER — HYDROCODONE-ACETAMINOPHEN 5-325 MG PO TABS: 1.0000 | ORAL_TABLET | Freq: Four times a day (QID) | ORAL | 0 refills | Status: DC | PRN

## 2018-06-13 MED ORDER — ONDANSETRON 4 MG PO TBDP: 4.0000 mg | ORAL_TABLET | Freq: Three times a day (TID) | ORAL | 0 refills | Status: DC | PRN

## 2018-06-13 NOTE — Discharge Instructions (Addendum)
You have colitis, which is inflammation in the colon, usually from a viral infection. Use zofran as prescribed, as needed for nausea. Alternate between naprosyn and norco as directed as needed for pain but don't drive or operate machinery while taking norco. Use bentyl as directed as needed for abdominal spasms and diarrhea. May consider using over the counter tums, maalox, pepto bismol, or other over the counter remedies to help with symptoms. Stay well hydrated with small sips of fluids throughout the day. Follow a BRAT (banana-rice-applesauce-toast) diet as described below for the next 24-48 hours. The 'BRAT' diet is suggested, then progress to diet as tolerated as symptoms abate. Call your regular doctor if bloody stools, persistent diarrhea, vomiting, fever or abdominal pain. Follow up with your regular doctor in 5-7 days for recheck of symptoms; call them on Monday to see if they can check a stool sample on you before you follow up. Return to ER for changing or worsening of symptoms.

## 2018-06-13 NOTE — ED Triage Notes (Signed)
Pt complains of abdominal pain that feels like contractions, nausea,, vomiting, diarrhea for the past couple days. Pt is currently on her menstrual period.

## 2018-06-13 NOTE — ED Provider Notes (Signed)
[facility]-EMERGENCY DEPT Provider Note   CSN: [id number] Arrival date & time: 06/13/18  1851     History   Chief Complaint Chief Complaint  Patient presents with  . Abdominal Pain    HPI Emily Glenn is a 42 y.o. female with a PMHx of anemia, asthma, and migraines, who presents to the ED with complaints of generalized abdominal pain, nausea, vomiting, and diarrhea that began yesterday.  She describes her pain as 10/10 intermittent squeezing nonradiating generalized abdominal pain that worsens with movement and has been unrelieved with Tylenol and Pepto-Bismol.  She reports associated nausea, too numerous to count episodes of nonbloody nonbilious emesis, and "a lot" of nonbloody looser than normal diarrhea but denies any watery diarrhea.  She is currently on her menstrual cycle, which started 4 days ago.  She denies fevers, chills, CP, SOB, constipation, obstipation, melena, hematochezia, hematemesis, hematuria, dysuria, vaginal discharge, myalgias, arthralgias, numbness, tingling, focal weakness, or any other complaints at this time. Denies recent travel, sick contacts, suspicious food intake, EtOH use, NSAID use, recent abx, or prior abd surgeries.   The history is provided by the patient and medical records. No language interpreter was used.  Abdominal Pain   Associated symptoms include diarrhea, nausea and vomiting. Pertinent negatives include fever, constipation, dysuria, hematuria, arthralgias and myalgias.    Past Medical History:  Diagnosis Date  . Refusal of blood transfusions as patient is Jehovah's Witness     There are no active problems to display for this patient.   History reviewed. No pertinent surgical history.   OB History   None      Home Medications    Prior to Admission medications   Medication Sig Start Date End Date Taking? Authorizing Provider  acetaminophen (TYLENOL) 500 MG tablet Take 500 mg by mouth every 6 (six) hours as  needed for moderate pain.    [provider]  benzonatate (TESSALON) 100 MG capsule Take 1 capsule (100 mg total) by mouth every 8 (eight) hours. Patient not taking: Reported on 04/19/2018 11/25/16   Charm Rings, MD  ibuprofen (ADVIL,MOTRIN) 800 MG tablet Take 1 tablet (800 mg total) by mouth 3 (three) times daily. 04/20/18   Roxy Horseman, PA-C  lidocaine (LMX) 4 % cream Apply 1 application topically as needed. Patient not taking: Reported on 04/19/2018 11/23/16   Pisciotta, Joni Reining, PA-C  ondansetron (ZOFRAN) 4 MG tablet Take 1 tablet (4 mg total) by mouth every 8 (eight) hours as needed for nausea or vomiting. Patient not taking: Reported on 04/19/2018 11/25/16   Charm Rings, MD  oseltamivir (TAMIFLU) 75 MG capsule Take 1 capsule (75 mg total) by mouth every 12 (twelve) hours. Patient not taking: Reported on 04/19/2018 11/25/16   Charm Rings, MD  predniSONE (DELTASONE) 50 MG tablet Take 1 tablet daily with breakfast Patient not taking: Reported on 04/19/2018 11/23/16   Pisciotta, Joni Reining, PA-C    Family History No family history on file.  Social History Social History   Tobacco Use  . Smoking status: Never Smoker  . Smokeless tobacco: Never Used  Substance Use Topics  . Alcohol use: No    Frequency: Never  . Drug use: No     Allergies   Patient has no known allergies.   Review of Systems Review of Systems  Constitutional: Negative for chills and fever.  Respiratory: Negative for shortness of breath.   Cardiovascular: Negative for chest pain.  Gastrointestinal: Positive for abdominal pain, diarrhea, nausea and vomiting. Negative  for blood in stool and constipation.  Genitourinary: Negative for dysuria, hematuria and vaginal discharge.  Musculoskeletal: Negative for arthralgias and myalgias.  Skin: Negative for color change.  Allergic/Immunologic: Negative for immunocompromised state.  Neurological: Negative for weakness and numbness.  Psychiatric/Behavioral:  Negative for confusion.   All other systems reviewed and are negative for acute change except as noted in the HPI.    Physical Exam Updated Vital Signs BP 116/78 (BP Location: Right Arm)   Pulse 91   Temp 98 F (36.7 C) (Oral)   Resp 18   LMP 06/13/2018   SpO2 100%   Physical Exam  Constitutional: She is oriented to person, place, and time. Vital signs are normal. She appears well-developed and well-nourished.  Non-toxic appearance. No distress.  Afebrile, nontoxic, NAD although looks like she doesn't feel well  HENT:  Head: Normocephalic and atraumatic.  Mouth/Throat: Oropharynx is clear and moist and mucous membranes are normal.  Eyes: Conjunctivae and EOM are normal. Right eye exhibits no discharge. Left eye exhibits no discharge.  Neck: Normal range of motion. Neck supple.  Cardiovascular: Normal rate, regular rhythm, normal heart sounds and intact distal pulses. Exam reveals no gallop and no friction rub.  No murmur heard. Pulmonary/Chest: Effort normal and breath sounds normal. No respiratory distress. She has no decreased breath sounds. She has no wheezes. She has no rhonchi. She has no rales.  Abdominal: Soft. Normal appearance and bowel sounds are normal. She exhibits no distension. There is generalized tenderness. There is no rigidity, no rebound, no guarding, no CVA tenderness, no tenderness at McBurney's point and negative Murphy's sign.  Soft, nondistended, +BS throughout, with moderate diffuse abd TTP throughout entire abdomen, no focal area of specific TTP, no r/g/r although appears uncomfortable during palpation of abdomen, neg murphy's, no focal mcburney's point TTP, no CVA TTP   Musculoskeletal: Normal range of motion.  Neurological: She is alert and oriented to person, place, and time. She has normal strength. No sensory deficit.  Skin: Skin is warm, dry and intact. No rash noted.  Psychiatric: She has a normal mood and affect.  Nursing note and vitals  reviewed.    ED Treatments / Results  Labs (all labs ordered are listed, but only abnormal results are displayed) Labs Reviewed  COMPREHENSIVE METABOLIC PANEL - Abnormal; Notable for the following components:      Result Value   Glucose, Bld 106 (*)    All other components within normal limits  URINALYSIS, ROUTINE W REFLEX MICROSCOPIC - Abnormal; Notable for the following components:   Color, Urine YELLOW (*)    APPearance HAZY (*)    Hgb urine dipstick MODERATE (*)    Leukocytes, UA TRACE (*)    Bacteria, UA FEW (*)    All other components within normal limits  CBC WITH DIFFERENTIAL/PLATELET  LIPASE, BLOOD  I-STAT BETA HCG BLOOD, ED ([facility], [facility], [facility] ONLY)    EKG None  Radiology Ct Abdomen Pelvis W Contrast  Result Date: 06/13/2018 CLINICAL DATA:  Initial evaluation for acute abdominal pain, evaluate for diverticulitis or colitis. EXAM: CT ABDOMEN AND PELVIS WITH CONTRAST TECHNIQUE: Multidetector CT imaging of the abdomen and pelvis was performed using the standard protocol following bolus administration of intravenous contrast. CONTRAST:  <See Chart> ISOVUE-300 IOPAMIDOL (ISOVUE-300) INJECTION 61% COMPARISON:  None available. FINDINGS: Lower chest: Mild subsegmental atelectatic changes seen within the visualized lung bases. Visualized lung bases are otherwise clear. Hepatobiliary: Liver demonstrates a normal contrast enhanced appearance gallbladder within normal limits. No biliary  dilatation. Pancreas: Pancreas within normal limits. Spleen: Spleen within normal limits. Adrenals/Urinary Tract: Adrenal glands are normal. Kidneys equal in size with symmetric enhancement. 13 mm cyst present at the lower pole of the left kidney. No nephrolithiasis, hydronephrosis, or focal enhancing renal mass. No hydroureter. Partially distended bladder within normal limits. Stomach/Bowel: Stomach within normal limits. No evidence for bowel obstruction. Normal appendix. Circumferential wall thickening with mild  mucosal edema involving the ascending and transverse colon, suggesting possible acute colitis. No complication identified. No other acute inflammatory changes seen about the bowels. Vascular/Lymphatic: Normal intravascular enhancement seen throughout the intra-abdominal aorta. No aneurysm. Mesenteric vessels patent proximally. No adenopathy. Reproductive: Uterus and ovaries within normal limits. Other: Small volume free fluid within the pelvis, which may be physiologic and/or reactive. No free intraperitoneal air. Musculoskeletal: No acute osseous abnormality. No worrisome lytic or blastic osseous lesions. IMPRESSION: 1. Wall thickening and edema involving the ascending and transverse colon, consistent with acute colitis. No complication identified. 2. No other acute intra-abdominal or pelvic process. Electronically Signed   By: Rise MuBenjamin  McClintock M.D.   On: 06/13/2018 21:52    Procedures Procedures (including critical care time)  Medications Ordered in ED Medications  sodium chloride 0.9 % bolus 1,000 mL (0 mLs Intravenous Stopped 06/13/18 2115)  ondansetron (ZOFRAN) injection 4 mg (4 mg Intravenous Given 06/13/18 2001)  morphine 4 MG/ML injection 4 mg (4 mg Intravenous Given 06/13/18 2001)  iopamidol (ISOVUE-300) 61 % injection 100 mL (100 mLs Intravenous Contrast Given 06/13/18 2121)  HYDROcodone-acetaminophen (NORCO/VICODIN) 5-325 MG per tablet 1 tablet (1 tablet Oral Given 06/13/18 2233)     Initial Impression / Assessment and Plan / ED Course  I have reviewed the triage vital signs and the nursing notes.  Pertinent labs & imaging results that were available during my care of the patient were reviewed by me and considered in my medical decision making (see chart for details).     42 y.o. female here with generalized abd pain and n/v/d x1 day. On exam, moderate diffuse abd TTP, no focal area of specific tenderness, nonperitoneal although appears very uncomfortable with exam. No focal  mcburney's tenderness, neg murphy's. DDx includes gastroenteritis, colitis, diverticulitis, etc. Will get labs and CT abd/pelv, will give pain/nausea meds and fluids then reassess shortly.   11:00 PM CBC w/diff WNL. CMP WNL. Lipase WNL. BetaHCG neg. U/A grossly contaminated with 11-20 squamous, nitrite neg, trace leuks, 6-10 WBCs, few bacteria; doubt UTI. CT abd/pelv showing acute colitis involving the ascending and transverse colon. Doubt ischemic colitis, likely viral etiology given reassuring labs and pt afebrile and nontoxic appearing. Pt feeling better and tolerating PO well. Hasn't had another diarrhea episode here, so no stool sample was collected. Will d/c home with zofran, bentyl, and pain meds. Advised adequate hydration, OTC remedies for additional relief of symptoms, and BRAT diet. F/up with PCP in 5-7 days for recheck. Doubt need for ppx abx as this is likely to make diarrhea worse, and there is no compelling evidence that she has a bacterial infection based on work up today; advised calling PCP Monday to see if they can check her stool sample before she follows up. I explained the diagnosis and have given explicit precautions to return to the ER including for any other new or worsening symptoms. The patient understands and accepts the medical plan as it's been dictated and I have answered their questions. Discharge instructions concerning home care and prescriptions have been given. The patient is STABLE and is discharged to  home in good condition.   [place] reviewed prior to dispensing controlled substance medications, and 2 year search was notable for: none found. Risks/benefits/alternatives and expectations discussed regarding controlled substances. Side effects of medications discussed. Informed consent obtained.    Final Clinical Impressions(s) / ED Diagnoses   Final diagnoses:  Acute colitis  Generalized abdominal pain  Nausea vomiting and diarrhea    ED Discharge Orders          Ordered    dicyclomine (BENTYL) 20 MG tablet  3 times daily with meals PRN     06/13/18 2259    ondansetron (ZOFRAN ODT) 4 MG disintegrating tablet  Every 8 hours PRN     06/13/18 2259    naproxen (NAPROSYN) 500 MG tablet  2 times daily PRN     06/13/18 2259    HYDROcodone-acetaminophen (NORCO) 5-325 MG tablet  Every 6 hours PRN     06/13/18 [address]2259           Lakiesha Ralphs, AlvordMercedes, New JerseyPA-C 06/13/18 2302    Tegeler, Canary Brimhristopher J, MD 06/14/18 0005

## 2018-07-15 DIAGNOSIS — M5416 Radiculopathy, lumbar region: Secondary | ICD-10-CM | POA: Insufficient documentation

## 2018-07-15 DIAGNOSIS — Z79899 Other long term (current) drug therapy: Secondary | ICD-10-CM | POA: Insufficient documentation

## 2018-07-15 DIAGNOSIS — M5489 Other dorsalgia: Secondary | ICD-10-CM | POA: Diagnosis not present

## 2018-07-15 DIAGNOSIS — M79605 Pain in left leg: Secondary | ICD-10-CM | POA: Diagnosis present

## 2018-07-16 ENCOUNTER — Other Ambulatory Visit: Payer: Self-pay

## 2018-07-16 ENCOUNTER — Emergency Department (HOSPITAL_COMMUNITY)
Admission: EM | Admit: 2018-07-16 | Discharge: 2018-07-16 | Disposition: A | Discharge status: Home / Self Care | Payer: Medicaid Other | Attending: Emergency Medicine | Admitting: Emergency Medicine

## 2018-07-16 ENCOUNTER — Encounter (HOSPITAL_COMMUNITY): Payer: Self-pay

## 2018-07-16 DIAGNOSIS — M5410 Radiculopathy, site unspecified: Secondary | ICD-10-CM

## 2018-07-16 MED ORDER — HYDROCODONE-ACETAMINOPHEN 5-325 MG PO TABS: 1.0000 | ORAL_TABLET | Freq: Four times a day (QID) | ORAL | 0 refills | Status: DC | PRN

## 2018-07-16 MED ORDER — KETOROLAC TROMETHAMINE 60 MG/2ML IM SOLN
60.0000 mg | Freq: Once | INTRAMUSCULAR | Status: AC
  Administered 2018-07-16: 60 mg via INTRAMUSCULAR
  Filled 2018-07-16: qty 2

## 2018-07-16 MED ORDER — OXYCODONE-ACETAMINOPHEN 5-325 MG PO TABS
2.0000 | ORAL_TABLET | Freq: Once | ORAL | Status: AC
  Administered 2018-07-16: 2 via ORAL
  Filled 2018-07-16: qty 2

## 2018-07-16 MED ORDER — PREDNISONE 10 MG PO TABS: 20.0000 mg | ORAL_TABLET | Freq: Two times a day (BID) | ORAL | 0 refills | Status: DC

## 2018-07-16 NOTE — ED Provider Notes (Signed)
[facility]-EMERGENCY DEPT Provider Note   CSN: [id number] Arrival date & time: 07/15/18  2325     History   Chief Complaint Chief Complaint  Patient presents with  . Leg Pain  . Back Pain    HPI Tytionna Cloyd is a 42 y.o. female.  Patient is a 43 year old female with no significant past medical history.  She presents with low back pain that has worsened over the past several weeks.  This began in the absence of any injury or trauma.  She reports pain that starts in her left lumbar region and radiates into her left leg.  She denies any weakness, numbness, or bowel or bladder complaints.  She reports intermittent back problems for the past year.  She has been to physical therapy, however has never had an MRI or imaging studies.  The history is provided by the patient.  Back Pain   This is a recurrent problem. Episode onset: 3 weeks ago. The problem occurs constantly. The problem has been gradually worsening. The pain is associated with no known injury. The pain is present in the lumbar spine. The quality of the pain is described as stabbing. The pain radiates to the left thigh. The pain is severe. The symptoms are aggravated by bending, twisting and certain positions. The pain is the same all the time. Pertinent negatives include no fever, no bowel incontinence, no bladder incontinence and no dysuria. She has tried NSAIDs and muscle relaxants for the symptoms. The treatment provided no relief.    Past Medical History:  Diagnosis Date  . Refusal of blood transfusions as patient is Jehovah's Witness     There are no active problems to display for this patient.   History reviewed. No pertinent surgical history.   OB History   None      Home Medications    Prior to Admission medications   Medication Sig Start Date End Date Taking? Authorizing Provider  acetaminophen (TYLENOL) 500 MG tablet Take 500 mg by mouth every 6 (six) hours as needed for moderate  pain.    [provider]  benzonatate (TESSALON) 100 MG capsule Take 1 capsule (100 mg total) by mouth every 8 (eight) hours. Patient not taking: Reported on 04/19/2018 11/25/16   Charm Rings, MD  dicyclomine (BENTYL) 20 MG tablet Take 1 tablet (20 mg total) by mouth 3 (three) times daily with meals as needed for spasms (abdominal spasms/cramping and/or diarrhea). 06/13/18   Street, [place]anch[place], PA-C  HYDROcodone-acetaminophen (NORCO) 5-325 MG tablet Take 1 tablet by mouth every 6 (six) hours as needed for severe pain. 06/13/18   Street, [place], PA-C  ibuprofen (ADVIL,MOTRIN) 800 MG tablet Take 1 tablet (800 mg total) by mouth 3 (three) times daily. Patient not taking: Reported on 06/13/2018 04/20/18   Roxy Horseman, PA-C  lidocaine (LMX) 4 % cream Apply 1 application topically as needed. Patient not taking: Reported on 04/19/2018 11/23/16   Pisciotta, Joni Reining, PA-C  naproxen (NAPROSYN) 500 MG tablet Take 1 tablet (500 mg total) by mouth 2 (two) times daily as needed for mild pain, moderate pain or headache (TAKE WITH MEALS.). 06/13/18   Street, Mercedes, PA-C  ondansetron (ZOFRAN ODT) 4 MG disintegrating tablet Take 1 tablet (4 mg total) by mouth every 8 (eight) hours as needed for nausea or vomiting. 06/13/18   Street, Mercedes, PA-C  ondansetron (ZOFRAN) 4 MG tablet Take 1 tablet (4 mg total) by mouth every 8 (eight) hours as needed for nausea or vomiting. Patient not  taking: Reported on 04/19/2018 11/25/16   Charm Rings, MD  oseltamivir (TAMIFLU) 75 MG capsule Take 1 capsule (75 mg total) by mouth every 12 (twelve) hours. Patient not taking: Reported on 04/19/2018 11/25/16   Charm Rings, MD  predniSONE (DELTASONE) 50 MG tablet Take 1 tablet daily with breakfast Patient not taking: Reported on 04/19/2018 11/23/16   Pisciotta, Joni Reining, PA-C  topiramate (TOPAMAX) 25 MG tablet Take 25 mg by mouth 2 (two) times daily.    [provider]    Family History History reviewed. No pertinent  family history.  Social History Social History   Tobacco Use  . Smoking status: Never Smoker  . Smokeless tobacco: Never Used  Substance Use Topics  . Alcohol use: No    Frequency: Never  . Drug use: No     Allergies   Patient has no known allergies.   Review of Systems Review of Systems  Constitutional: Negative for fever.  Gastrointestinal: Negative for bowel incontinence.  Genitourinary: Negative for bladder incontinence and dysuria.  Musculoskeletal: Positive for back pain.  All other systems reviewed and are negative.    Physical Exam Updated Vital Signs BP 122/78 (BP Location: Right Arm)   Pulse 80   Temp 98.4 F (36.9 C) (Oral)   Resp 16   SpO2 100%   Physical Exam  Constitutional: She is oriented to person, place, and time. She appears well-developed and well-nourished. No distress.  HENT:  Head: Normocephalic and atraumatic.  Neck: Normal range of motion. Neck supple.  Pulmonary/Chest: Effort normal.  Musculoskeletal:  There is tenderness to palpation in the soft tissues of the left lower lumbar region.  Neurological: She is alert and oriented to person, place, and time.  Strength is 5 out of 5 in both lower extremities.  DTRs are 2+ and symmetrical in the patellar and Achilles tendons bilaterally.  Skin: Skin is warm and dry. She is not diaphoretic.  Nursing note and vitals reviewed.    ED Treatments / Results  Labs (all labs ordered are listed, but only abnormal results are displayed) Labs Reviewed - No data to display  EKG None  Radiology No results found.  Procedures Procedures (including critical care time)  Medications Ordered in ED Medications  ketorolac (TORADOL) injection 60 mg (has no administration in time range)  oxyCODONE-acetaminophen (PERCOCET/ROXICET) 5-325 MG per tablet 2 tablet (has no administration in time range)     Initial Impression / Assessment and Plan / ED Course  I have reviewed the triage vital signs and  the nursing notes.  Pertinent labs & imaging results that were available during my care of the patient were reviewed by me and considered in my medical decision making (see chart for details).  Patient with what appears to be sciatica.  This has been an ongoing problem for quite some time.  Her physical examination today reveals no deficits in strength or reflex and she has no bowel or bladder issues.  There is nothing that would indicate an emergent situation.  She is feeling better after receiving Toradol, Percocet, and prednisone.  I feel as though she is appropriate for discharge with steroids, pain medicine, and follow-up with her primary doctor if not improving.  As this problem has been present greater than 1 year, she may benefit from an MRI which she tells me she has not had done.  Final Clinical Impressions(s) / ED Diagnoses   Final diagnoses:  None    ED Discharge Orders    None  Geoffery Lyonselo, Kyion Gautier, MD 07/16/18 0530

## 2018-07-16 NOTE — ED Triage Notes (Signed)
Pt reports pain in her L lower back that radiates through her buttocks and down her leg x2 months. A&Ox4, Ambulatory.

## 2018-07-16 NOTE — Discharge Instructions (Addendum)
Prednisone as prescribed. ° °Hydrocodone is prescribed as needed for pain. ° °Follow-up with your primary doctor if symptoms are not improving in the next week. °

## 2018-09-06 ENCOUNTER — Emergency Department (HOSPITAL_COMMUNITY)
Admission: EM | Admit: 2018-09-06 | Discharge: 2018-09-07 | Disposition: A | Discharge status: Home / Self Care | Payer: Medicaid Other | Attending: Emergency Medicine | Admitting: Emergency Medicine

## 2018-09-06 ENCOUNTER — Encounter (HOSPITAL_COMMUNITY): Payer: Self-pay | Admitting: Nurse Practitioner

## 2018-09-06 DIAGNOSIS — J069 Acute upper respiratory infection, unspecified: Secondary | ICD-10-CM

## 2018-09-06 DIAGNOSIS — J029 Acute pharyngitis, unspecified: Secondary | ICD-10-CM | POA: Diagnosis present

## 2018-09-06 NOTE — ED Provider Notes (Signed)
[facility]-EMERGENCY DEPT Provider Note   CSN: [id number] Arrival date & time: 09/06/18  2113     History   Chief Complaint Chief Complaint  Patient presents with  . URI    HPI Cynara Tatham is a 42 y.o. female.  HPI   Presents with sore throat, bilateral ear pain right worse than left, cough and chills Present for 2 days Tried tylenol, throat lozenges without relief No known sick contacts Not sure if fever ,feeling hot Pain with swallowing, both sides No drooling, when talking and swallowing has ear pain as well   Past Medical History:  Diagnosis Date  . Refusal of blood transfusions as patient is Jehovah's Witness     There are no active problems to display for this patient.   History reviewed. No pertinent surgical history.   OB History   None      Home Medications    Prior to Admission medications   Medication Sig Start Date End Date Taking? Authorizing Provider  acetaminophen (TYLENOL) 500 MG tablet Take 500 mg by mouth every 6 (six) hours as needed for moderate pain.    [provider]  benzonatate (TESSALON) 100 MG capsule Take 1 capsule (100 mg total) by mouth 3 (three) times daily as needed for cough. 09/07/18   Alvira Monday, MD  dicyclomine (BENTYL) 20 MG tablet Take 1 tablet (20 mg total) by mouth 3 (three) times daily with meals as needed for spasms (abdominal spasms/cramping and/or diarrhea). 06/13/18   Street, [place], PA-C  HYDROcodone-acetaminophen (NORCO) 5-325 MG tablet Take 1-2 tablets by mouth every 6 (six) hours as needed. 07/16/18   Geoffery Lyons, MD  ibuprofen (ADVIL,MOTRIN) 800 MG tablet Take 1 tablet (800 mg total) by mouth 3 (three) times daily. Patient not taking: Reported on 06/13/2018 04/20/18   Roxy Horseman, PA-C  lidocaine (LMX) 4 % cream Apply 1 application topically as needed. Patient not taking: Reported on 04/19/2018 11/23/16   Pisciotta, Joni Reining, PA-C  naproxen (NAPROSYN) 500 MG tablet Take 1  tablet (500 mg total) by mouth 2 (two) times daily as needed for mild pain, moderate pain or headache (TAKE WITH MEALS.). 06/13/18   Street, Mercedes, PA-C  ondansetron (ZOFRAN ODT) 4 MG disintegrating tablet Take 1 tablet (4 mg total) by mouth every 8 (eight) hours as needed for nausea or vomiting. 06/13/18   Street, Mercedes, PA-C  ondansetron (ZOFRAN) 4 MG tablet Take 1 tablet (4 mg total) by mouth every 8 (eight) hours as needed for nausea or vomiting. Patient not taking: Reported on 04/19/2018 11/25/16   Charm Rings, MD  oseltamivir (TAMIFLU) 75 MG capsule Take 1 capsule (75 mg total) by mouth every 12 (twelve) hours. Patient not taking: Reported on 04/19/2018 11/25/16   Charm Rings, MD  predniSONE (DELTASONE) 10 MG tablet Take 2 tablets (20 mg total) by mouth 2 (two) times daily with a meal. 07/16/18   Geoffery Lyons, MD  topiramate (TOPAMAX) 25 MG tablet Take 25 mg by mouth 2 (two) times daily.    [provider]    Family History No family history on file.  Social History Social History   Tobacco Use  . Smoking status: Never Smoker  . Smokeless tobacco: Never Used  Substance Use Topics  . Alcohol use: No    Frequency: Never  . Drug use: No     Allergies   Patient has no known allergies.   Review of Systems Review of Systems  Constitutional: Positive for chills and  fatigue. Negative for fever.  HENT: Positive for sore throat.   Eyes: Negative for visual disturbance.  Respiratory: Positive for cough. Negative for shortness of breath.   Cardiovascular: Negative for chest pain.  Gastrointestinal: Negative for abdominal pain, nausea and vomiting.  Genitourinary: Negative for difficulty urinating.  Musculoskeletal: Negative for back pain and myalgias.  Skin: Negative for rash.  Neurological: Positive for headaches. Negative for syncope.     Physical Exam Updated Vital Signs BP 122/84 (BP Location: Left Arm)   Pulse 77   Temp 98.3 F (36.8 C)   Resp 17   SpO2  99%   Physical Exam  Constitutional: She is oriented to person, place, and time. She appears well-developed and well-nourished. No distress.  HENT:  Head: Normocephalic and atraumatic.  Eyes: Conjunctivae and EOM are normal.  Neck: Normal range of motion.  Cardiovascular: Normal rate, regular rhythm, normal heart sounds and intact distal pulses. Exam reveals no gallop and no friction rub.  No murmur heard. Pulmonary/Chest: Effort normal and breath sounds normal. No respiratory distress. She has no wheezes. She has no rales.  Abdominal: Soft. She exhibits no distension. There is no tenderness. There is no guarding.  Musculoskeletal: She exhibits no edema or tenderness.  Neurological: She is alert and oriented to person, place, and time.  Skin: Skin is warm and dry. No rash noted. She is not diaphoretic. No erythema.  Nursing note and vitals reviewed.    ED Treatments / Results  Labs (all labs ordered are listed, but only abnormal results are displayed) Labs Reviewed - No data to display  EKG None  Radiology No results found.  Procedures Procedures (including critical care time)  Medications Ordered in ED Medications - No data to display   Initial Impression / Assessment and Plan / ED Course  I have reviewed the triage vital signs and the nursing notes.  Pertinent labs & imaging results that were available during my care of the patient were reviewed by me and considered in my medical decision making (see chart for details).     42yo female presents with cough, congestion, sore throat.  Centor 0, doubt strep throat. No hypoxia, tachypnea, fever here, and pt with clear breath sounds bilaterally and doubt pneumonia. No sign of acute bacterial infection by history and exam. Suspect likely viral URI. Rec supportive care, and gave rx for tessalon. Patient discharged in stable condition with understanding of reasons to return.   Final Clinical Impressions(s) / ED Diagnoses    Final diagnoses:  Upper respiratory tract infection, unspecified type    ED Discharge Orders         Ordered    benzonatate (TESSALON) 100 MG capsule  3 times daily PRN     09/07/18 0007           Alvira Monday, MD 09/07/18 1401

## 2018-09-06 NOTE — ED Triage Notes (Signed)
Pt is c/o throat, nose and ear irritation that has ben ongoing for the last 2 days.

## 2018-09-07 MED ORDER — BENZONATATE 100 MG PO CAPS: 100.0000 mg | ORAL_CAPSULE | Freq: Three times a day (TID) | ORAL | 0 refills | Status: DC | PRN

## 2018-10-07 ENCOUNTER — Emergency Department (HOSPITAL_COMMUNITY)
Admission: EM | Admit: 2018-10-07 | Discharge: 2018-10-07 | Discharge status: Against Medical Advice | Payer: Medicaid Other | Attending: Emergency Medicine | Admitting: Emergency Medicine

## 2018-10-07 ENCOUNTER — Emergency Department (HOSPITAL_COMMUNITY): Payer: Medicaid Other

## 2018-10-07 ENCOUNTER — Other Ambulatory Visit: Payer: Self-pay

## 2018-10-07 ENCOUNTER — Encounter (HOSPITAL_COMMUNITY): Payer: Self-pay | Admitting: Emergency Medicine

## 2018-10-07 DIAGNOSIS — Z5321 Procedure and treatment not carried out due to patient leaving prior to being seen by health care provider: Secondary | ICD-10-CM | POA: Diagnosis not present

## 2018-10-07 DIAGNOSIS — R0789 Other chest pain: Secondary | ICD-10-CM | POA: Diagnosis present

## 2018-10-07 LAB — CBC
HEMATOCRIT: 40.1 % (ref 36.0–46.0)
Hemoglobin: 12.2 g/dL (ref 12.0–15.0)
MCH: 26.6 pg (ref 26.0–34.0)
MCHC: 30.4 g/dL (ref 30.0–36.0)
MCV: 87.6 fL (ref 80.0–100.0)
Platelets: 227 10*3/uL (ref 150–400)
RBC: 4.58 MIL/uL (ref 3.87–5.11)
RDW: 13.7 % (ref 11.5–15.5)
WBC: 5.8 10*3/uL (ref 4.0–10.5)
nRBC: 0 % (ref 0.0–0.2)

## 2018-10-07 LAB — BASIC METABOLIC PANEL
Anion gap: 6 (ref 5–15)
BUN: 9 mg/dL (ref 6–20)
CO2: 28 mmol/L (ref 22–32)
Calcium: 9.2 mg/dL (ref 8.9–10.3)
Chloride: 104 mmol/L (ref 98–111)
Creatinine, Ser: 0.89 mg/dL (ref 0.44–1.00)
GFR calc Af Amer: >60 mL/min (ref >60)
Glucose, Bld: 105 mg/dL — ABNORMAL HIGH (ref 70–99)
Potassium: 4 mmol/L (ref 3.5–5.1)
Sodium: 138 mmol/L (ref 135–145)

## 2018-10-07 LAB — I-STAT TROPONIN, ED: Troponin i, poc: 0 ng/mL (ref 0.00–0.08)

## 2018-10-07 LAB — I-STAT BETA HCG BLOOD, ED (MC, WL, AP ONLY): I-stat hCG, quantitative: <5 m[IU]/mL (ref <5)

## 2018-10-07 NOTE — ED Triage Notes (Signed)
Pt c/o chest pain that radiates to the back and shortness of breath x 1 day. 

## 2018-10-07 NOTE — ED Notes (Signed)
Pt states that she doesn't want to wait anymore and is leaving, encouraged pt to stay and be seen, pt states that she still wants to leave.

## 2019-01-15 IMAGING — DX DG CHEST 2V
2 series · 2 of 2 positions shown · non-contrast
Comparison: 11/13/2017

CLINICAL DATA: Chest pain

EXAM:
CHEST - 2 VIEW

[chest pa]
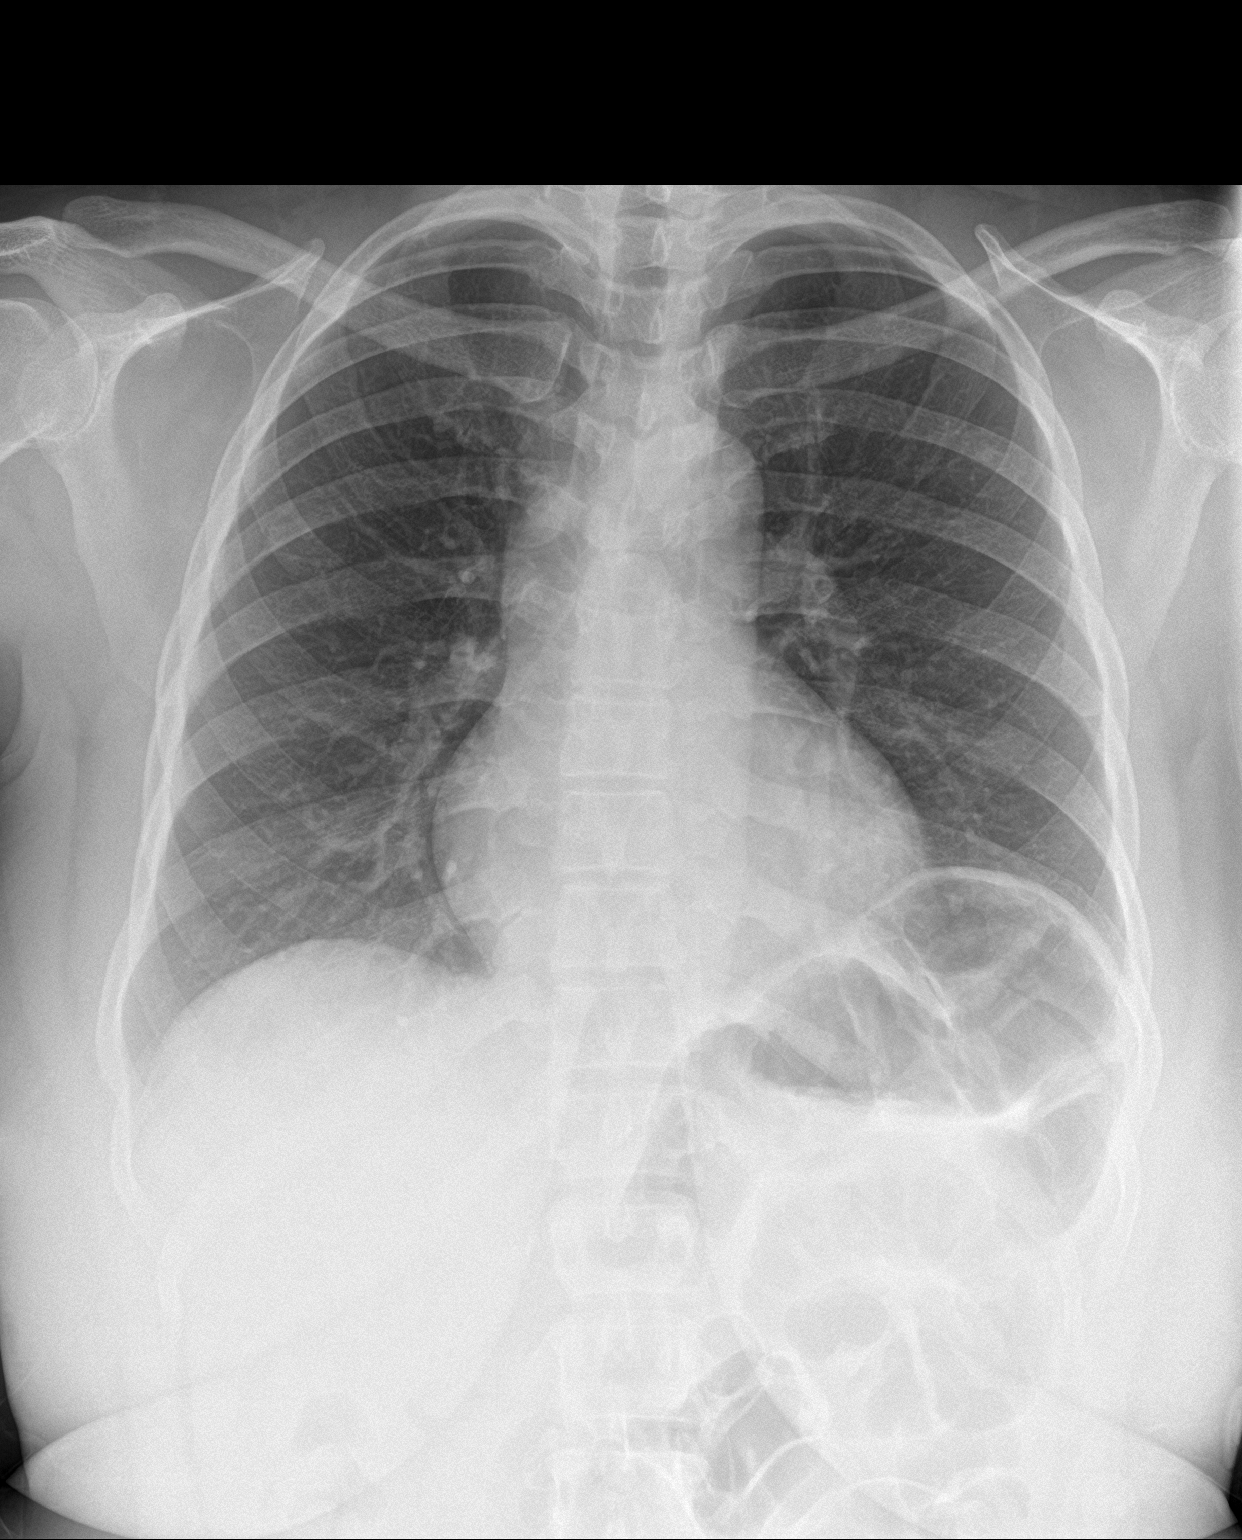

[chest lat]
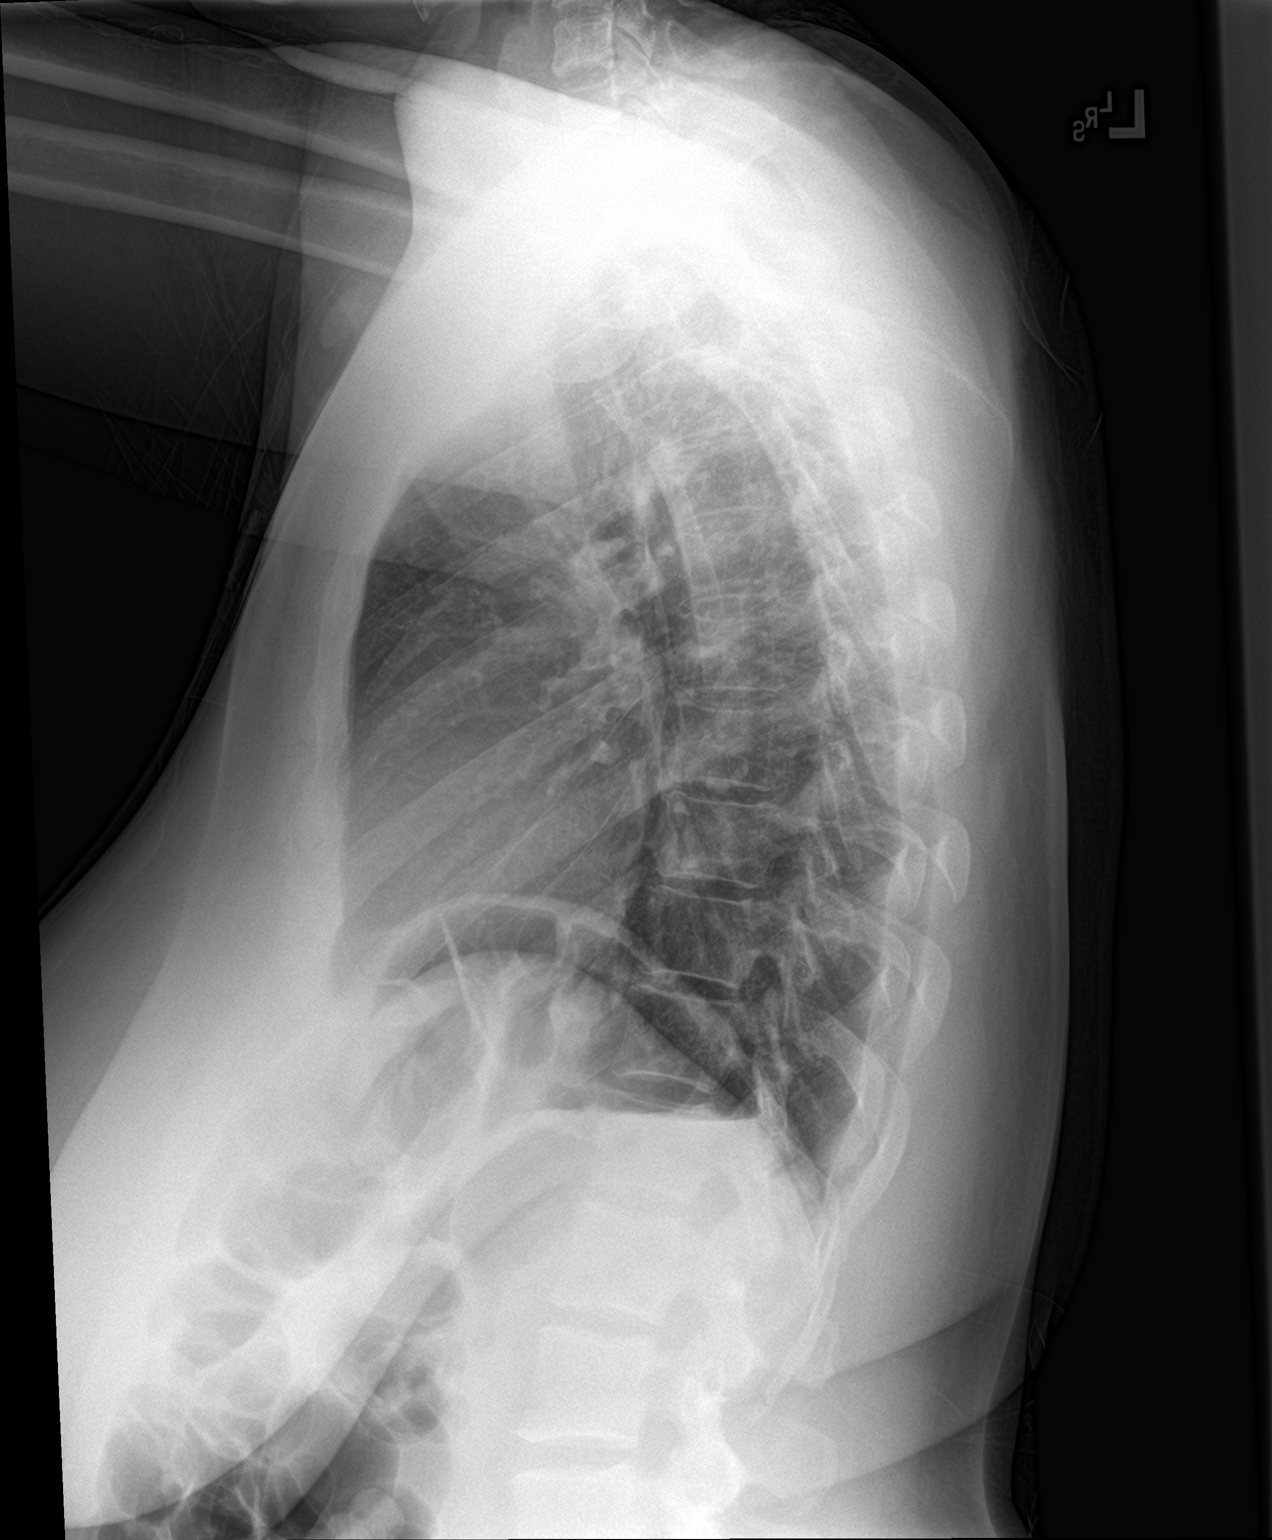

[2 of 2 positions shown; findings below may reference images not displayed]

FINDINGS: The heart size and mediastinal contours are within normal limits.
Both lungs are clear. The visualized skeletal structures are
unremarkable.
IMPRESSION: No active cardiopulmonary disease.

## 2019-09-06 ENCOUNTER — Other Ambulatory Visit: Payer: Self-pay

## 2019-09-06 DIAGNOSIS — Z20822 Contact with and (suspected) exposure to covid-19: Secondary | ICD-10-CM

## 2019-09-07 ENCOUNTER — Ambulatory Visit: Payer: Self-pay

## 2019-09-07 LAB — NOVEL CORONAVIRUS, NAA: SARS-CoV-2, NAA: DETECTED — AB

## 2019-09-08 NOTE — Telephone Encounter (Signed)
Provided covid test results to patient along with care advice. Pt. Voiced understanding.

## 2020-04-23 DIAGNOSIS — G44209 Tension-type headache, unspecified, not intractable: Secondary | ICD-10-CM | POA: Diagnosis not present

## 2020-04-23 DIAGNOSIS — H40033 Anatomical narrow angle, bilateral: Secondary | ICD-10-CM | POA: Diagnosis not present

## 2020-05-25 ENCOUNTER — Ambulatory Visit: Payer: Medicaid Other | Attending: Internal Medicine

## 2020-05-25 DIAGNOSIS — Z23 Encounter for immunization: Secondary | ICD-10-CM

## 2020-05-25 NOTE — Progress Notes (Signed)
   Covid-19 Vaccination Clinic  Name:  Lillyen Schow    MRN: [id number] DOB: Feb 03, 1976  05/25/2020  Ms. Kushner was observed post Covid-19 immunization for 15 minutes without incident. She was provided with Vaccine Information Sheet and instruction to access the V-Safe system.   Ms. Gardenhire was instructed to call 911 with any severe reactions post vaccine: Marland Kitchen Difficulty breathing  . Swelling of face and throat  . A fast heartbeat  . A bad rash all over body  . Dizziness and weakness   Immunizations Administered    Name Date Dose VIS Date Route   JANSSEN COVID-19 VACCINE 05/25/2020  8:31 AM 0.5 mL 12/25/2019 Intramuscular   Manufacturer: [employer]   Lot: [id number]   NDC: 20813-887-19

## 2020-10-10 DIAGNOSIS — R7303 Prediabetes: Secondary | ICD-10-CM | POA: Diagnosis not present

## 2020-10-10 DIAGNOSIS — R5383 Other fatigue: Secondary | ICD-10-CM | POA: Diagnosis not present

## 2020-10-10 DIAGNOSIS — D649 Anemia, unspecified: Secondary | ICD-10-CM | POA: Diagnosis not present

## 2020-10-10 DIAGNOSIS — R6882 Decreased libido: Secondary | ICD-10-CM | POA: Diagnosis not present

## 2020-12-07 DIAGNOSIS — E119 Type 2 diabetes mellitus without complications: Secondary | ICD-10-CM | POA: Diagnosis not present

## 2020-12-07 DIAGNOSIS — H209 Unspecified iridocyclitis: Secondary | ICD-10-CM | POA: Diagnosis not present

## 2020-12-07 DIAGNOSIS — Z794 Long term (current) use of insulin: Secondary | ICD-10-CM | POA: Diagnosis not present

## 2020-12-07 DIAGNOSIS — H2000 Unspecified acute and subacute iridocyclitis: Secondary | ICD-10-CM | POA: Diagnosis not present

## 2020-12-07 DIAGNOSIS — H20052 Hypopyon, left eye: Secondary | ICD-10-CM | POA: Diagnosis not present

## 2020-12-11 DIAGNOSIS — E119 Type 2 diabetes mellitus without complications: Secondary | ICD-10-CM | POA: Diagnosis not present

## 2020-12-11 DIAGNOSIS — H20052 Hypopyon, left eye: Secondary | ICD-10-CM | POA: Diagnosis not present

## 2020-12-11 DIAGNOSIS — Z794 Long term (current) use of insulin: Secondary | ICD-10-CM | POA: Diagnosis not present

## 2020-12-12 DIAGNOSIS — R5383 Other fatigue: Secondary | ICD-10-CM | POA: Diagnosis not present

## 2020-12-12 DIAGNOSIS — E119 Type 2 diabetes mellitus without complications: Secondary | ICD-10-CM | POA: Diagnosis not present

## 2020-12-12 DIAGNOSIS — H3581 Retinal edema: Secondary | ICD-10-CM | POA: Diagnosis not present

## 2020-12-12 DIAGNOSIS — H209 Unspecified iridocyclitis: Secondary | ICD-10-CM | POA: Diagnosis not present

## 2020-12-19 DIAGNOSIS — Z794 Long term (current) use of insulin: Secondary | ICD-10-CM | POA: Diagnosis not present

## 2020-12-19 DIAGNOSIS — E119 Type 2 diabetes mellitus without complications: Secondary | ICD-10-CM | POA: Diagnosis not present

## 2020-12-19 DIAGNOSIS — H209 Unspecified iridocyclitis: Secondary | ICD-10-CM | POA: Diagnosis not present

## 2020-12-26 DIAGNOSIS — H3581 Retinal edema: Secondary | ICD-10-CM | POA: Diagnosis not present

## 2020-12-26 DIAGNOSIS — H44112 Panuveitis, left eye: Secondary | ICD-10-CM | POA: Diagnosis not present

## 2020-12-26 DIAGNOSIS — E119 Type 2 diabetes mellitus without complications: Secondary | ICD-10-CM | POA: Diagnosis not present

## 2021-01-30 DIAGNOSIS — H44112 Panuveitis, left eye: Secondary | ICD-10-CM | POA: Diagnosis not present

## 2021-01-30 DIAGNOSIS — Z79899 Other long term (current) drug therapy: Secondary | ICD-10-CM | POA: Diagnosis not present

## 2021-01-30 DIAGNOSIS — H3581 Retinal edema: Secondary | ICD-10-CM | POA: Diagnosis not present

## 2021-01-30 DIAGNOSIS — E119 Type 2 diabetes mellitus without complications: Secondary | ICD-10-CM | POA: Diagnosis not present

## 2021-03-05 DIAGNOSIS — H5213 Myopia, bilateral: Secondary | ICD-10-CM | POA: Diagnosis not present

## 2021-03-05 NOTE — Progress Notes (Addendum)
   Subjective:    Patient ID: Emily Glenn, female    DOB: 1976-06-15, 45 y.o.   MRN: [id number]   CC: Establish care  HPI:  Emily Glenn is a very pleasant 45 y.o. female who presents today to establish care.  Initial concerns:  Diabetes Current Regimen: Victoza 1.8 mg daily CBGs: Average of 180, lows 40 (last low 2 months prior patient states that she just ate food and blood sugar came up), highs 205 Last A1c: Not on record Denies polyuria, polydipsia  Sciatic nerve pain Occurs intermittently ongoing for 2 years.  Right worse than left but switches leg intermittently.  Was on a muscle relaxer prior without much relief.  Does have radiation down the back of the leg. Denies issues without bowel/bladder. No loss of pelvic sensation. No known fevers or chills.  Past medical history:  Left eye pain  Migraine headaches  Breast Lump  Scoliosis  Past surgical history:  Tubal ligation (14 years ago)  Current medications: Medication list UTD  Family history: See history tab  Social history: Works for company [employer]. Married. 3 children (2 boys, 1 girl)  ROS: pertinent noted in the HPI   Objective:  BP 122/78   Pulse 82   Ht 5\' 4"  (1.626 m)   Wt 156 lb 12.8 oz (71.1 kg)   LMP 02/19/2021 (Exact Date)   SpO2 99%   BMI 26.91 kg/m   Vitals and nursing note reviewed  General: NAD, pleasant, able to participate in exam Cardiac: RRR, S1 S2 present. normal heart sounds, no murmurs. Respiratory: CTAB, normal effort, No wheezes, rales or rhonchi Abdomen: Bowel sounds present, non-tender, non-distended, no hepatosplenomegaly Extremities: no edema or cyanosis. Skin: warm and dry, no rashes noted Neuro: alert, no obvious focal deficits Psych: Normal affect and mood  Assessment & Plan:   Diabetes mellitus without complication (HCC) Well controlled. A1c today 5.4.  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 R>L. On muscle relaxer in the past.  -Sciatic stretches -Consider neuropathic medications for symptom relief or [facility] referral if continues to be distressing -F/u in 2 weeks  - Patient signed record release form for labs mammogram and Pap smear.  Consider repeating depending on if testing is up-to-date.  [facility], DO [facility] PGY-2

## 2021-03-06 ENCOUNTER — Other Ambulatory Visit: Payer: Self-pay

## 2021-03-06 ENCOUNTER — Encounter: Payer: Self-pay | Admitting: Family Medicine

## 2021-03-06 ENCOUNTER — Ambulatory Visit: Payer: Medicaid Other | Admitting: Family Medicine

## 2021-03-06 VITALS — BP 122/78 | HR 82 | Ht 64.0 in | Wt 156.8 lb

## 2021-03-06 DIAGNOSIS — E119 Type 2 diabetes mellitus without complications: Secondary | ICD-10-CM | POA: Diagnosis present

## 2021-03-06 DIAGNOSIS — M5431 Sciatica, right side: Secondary | ICD-10-CM | POA: Insufficient documentation

## 2021-03-06 LAB — POCT GLYCOSYLATED HEMOGLOBIN (HGB A1C): Hemoglobin A1C: 5.4 % (ref 4.0–5.6)

## 2021-03-06 MED ORDER — VICTOZA 18 MG/3ML ~~LOC~~ SOPN: 1.8000 mg | PEN_INJECTOR | Freq: Every day | SUBCUTANEOUS | 0 refills | Status: DC

## 2021-03-06 NOTE — Assessment & Plan Note (Addendum)
Well controlled. A1c today 5.4.  -Continue victoza 1.8 mg daily -F/u in 2 weeks consider decreasing to 1.2 mg

## 2021-03-06 NOTE — Patient Instructions (Addendum)
It was wonderful to see you today.  Please bring ALL of your medications with you to every visit.   Today we talked about:  Diabetes we will check your A1c today.  I have refilled her Victoza.  We also discussed your right sided sciatic nerve pain.  Below are exercises to try.  Please be sure to schedule follow up at the front  desk before you leave today.   If you haven't already, sign up for My Chart to have easy access to your labs results, and communication with your primary care physician.  Please call the clinic at [phone] if your symptoms worsen or you have any concerns. It was our pleasure to serve you.  Dr. Salvadore Dom  Sciatica Rehab Ask your health care provider which exercises are safe for you. Do exercises exactly as told by your health care provider and adjust them as directed. It is normal to feel mild stretching, pulling, tightness, or discomfort as you do these exercises. Stop right away if you feel sudden pain or your pain gets worse. Do not begin these exercises until told by your health care provider. Stretching and range-of-motion exercises These exercises warm up your muscles and joints and improve the movement and flexibility of your hips and back. These exercises also help to relieve pain, numbness, and tingling. Sciatic nerve glide 1. Sit in a chair with your head facing down toward your chest. Place your hands behind your back. Let your shoulders slump forward. 2. Slowly straighten one of your legs while you tilt your head back as if you are looking toward the ceiling. Only straighten your leg as far as you can without making your symptoms worse. 3. Hold this position for __________ seconds. 4. Slowly return to the starting position. 5. Repeat with your other leg. Repeat __________ times. Complete this exercise __________ times a day. Knee to chest with hip adduction and internal rotation 1. Lie on your back on a firm surface with both legs  straight. 2. Bend one of your knees and move it up toward your chest until you feel a gentle stretch in your lower back and buttock. Then, move your knee toward the shoulder that is on the opposite side from your leg. This is hip adduction and internal rotation. ? Hold your leg in this position by holding on to the front of your knee. 3. Hold this position for __________ seconds. 4. Slowly return to the starting position. 5. Repeat with your other leg. Repeat __________ times. Complete this exercise __________ times a day.   Prone extension on elbows 1. Lie on your abdomen on a firm surface. A bed may be too soft for this exercise. 2. Prop yourself up on your elbows. 3. Use your arms to help lift your chest up until you feel a gentle stretch in your abdomen and your lower back. ? This will place some of your body weight on your elbows. If this is uncomfortable, try stacking pillows under your chest. ? Your hips should stay down, against the surface that you are lying on. Keep your hip and back muscles relaxed. 4. Hold this position for __________ seconds. 5. Slowly relax your upper body and return to the starting position. Repeat __________ times. Complete this exercise __________ times a day.   Strengthening exercises These exercises build strength and endurance in your back. Endurance is the ability to use your muscles for a long time, even after they get tired. Pelvic tilt This exercise strengthens the muscles that  lie deep in the abdomen. 1. Lie on your back on a firm surface. Bend your knees and keep your feet flat on the floor. 2. Tense your abdominal muscles. Tip your pelvis up toward the ceiling and flatten your lower back into the floor. ? To help with this exercise, you may place a small towel under your lower back and try to push your back into the towel. 3. Hold this position for __________ seconds. 4. Let your muscles relax completely before you repeat this exercise. Repeat  __________ times. Complete this exercise __________ times a day. Alternating arm and leg raises 1. Get on your hands and knees on a firm surface. If you are on a hard floor, you may want to use padding, such as an exercise mat, to cushion your knees. 2. Line up your arms and legs. Your hands should be directly below your shoulders, and your knees should be directly below your hips. 3. Lift your left leg behind you. At the same time, raise your right arm and straighten it in front of you. ? Do not lift your leg higher than your hip. ? Do not lift your arm higher than your shoulder. ? Keep your abdominal and back muscles tight. ? Keep your hips facing the ground. ? Do not arch your back. ? Keep your balance carefully, and do not hold your breath. 4. Hold this position for __________ seconds. 5. Slowly return to the starting position. 6. Repeat with your right leg and your left arm. Repeat __________ times. Complete this exercise __________ times a day.   Posture and body mechanics Good posture and healthy body mechanics can help to relieve stress in your body's tissues and joints. Body mechanics refers to the movements and positions of your body while you do your daily activities. Posture is part of body mechanics. Good posture means:  Your spine is in its natural S-curve position (neutral).  Your shoulders are pulled back slightly.  Your head is not tipped forward. Follow these guidelines to improve your posture and body mechanics in your everyday activities. Standing  When standing, keep your spine neutral and your feet about hip width apart. Keep a slight bend in your knees. Your ears, shoulders, and hips should line up.  When you do a task in which you stand in one place for a long time, place one foot up on a stable object that is 2-4 inches (5-10 cm) high, such as a footstool. This helps keep your spine neutral.   Sitting  When sitting, keep your spine neutral and keep your feet  flat on the floor. Use a footrest, if necessary, and keep your thighs parallel to the floor. Avoid rounding your shoulders, and avoid tilting your head forward.  When working at a desk or a computer, keep your desk at a height where your hands are slightly lower than your elbows. Slide your chair under your desk so you are close enough to maintain good posture.  When working at a computer, place your monitor at a height where you are looking straight ahead and you do not have to tilt your head forward or downward to look at the screen.   Resting  When lying down and resting, avoid positions that are most painful for you.  If you have pain with activities such as sitting, bending, stooping, or squatting, lie in a position in which your body does not bend very much. For example, avoid curling up on your side with your arms and  knees near your chest (fetal position).  If you have pain with activities such as standing for a long time or reaching with your arms, lie with your spine in a neutral position and bend your knees slightly. Try the following positions: ? Lying on your side with a pillow between your knees. ? Lying on your back with a pillow under your knees. Lifting  When lifting objects, keep your feet at least shoulder width apart and tighten your abdominal muscles.  Bend your knees and hips and keep your spine neutral. It is important to lift using the strength of your legs, not your back. Do not lock your knees straight out.  Always ask for help to lift heavy or awkward objects.   This information is not intended to replace advice given to you by your health care provider. Make sure you discuss any questions you have with your health care provider. Document Revised: 02/05/2019 Document Reviewed: 11/05/2018 Elsevier Patient Education  2021 [employer].

## 2021-03-06 NOTE — Assessment & Plan Note (Addendum)
Chronic for 2 years intermittently. R>L. On muscle relaxer in the past. No red flags. -Sciatic stretches -Consider neuropathic medications for symptom relief or [facility] referral if continues to be distressing -F/u in 2 weeks

## 2021-04-04 DIAGNOSIS — H524 Presbyopia: Secondary | ICD-10-CM | POA: Diagnosis not present

## 2021-04-11 ENCOUNTER — Ambulatory Visit (HOSPITAL_COMMUNITY)
Admission: EM | Admit: 2021-04-11 | Discharge: 2021-04-11 | Disposition: A | Discharge status: Home / Self Care | Payer: PRIVATE HEALTH INSURANCE | Attending: Medical Oncology | Admitting: Medical Oncology

## 2021-04-11 ENCOUNTER — Other Ambulatory Visit: Payer: Self-pay

## 2021-04-11 ENCOUNTER — Encounter (HOSPITAL_COMMUNITY): Payer: Self-pay | Admitting: Emergency Medicine

## 2021-04-11 DIAGNOSIS — R0981 Nasal congestion: Secondary | ICD-10-CM | POA: Diagnosis not present

## 2021-04-11 DIAGNOSIS — J069 Acute upper respiratory infection, unspecified: Secondary | ICD-10-CM | POA: Diagnosis not present

## 2021-04-11 MED ORDER — BENZONATATE 100 MG PO CAPS: 100.0000 mg | ORAL_CAPSULE | Freq: Three times a day (TID) | ORAL | 0 refills | Status: DC

## 2021-04-11 MED ORDER — PREDNISONE 10 MG PO TABS: ORAL_TABLET | ORAL | 0 refills | Status: DC

## 2021-04-11 MED ORDER — FLUTICASONE PROPIONATE 50 MCG/ACT NA SUSP: 2.0000 | Freq: Every day | NASAL | 0 refills | Status: DC

## 2021-04-11 NOTE — ED Provider Notes (Signed)
[facility]    CSN: [id number] Arrival date & time: 04/11/21  1150      History   Chief Complaint Chief Complaint  Patient presents with   Headache   sinus pressure    Nasal Congestion   Cough    HPI Emily Glenn is a 45 y.o. female.   HPI  Cold symptoms: Patient reports that for the past week she has had sinus congestion, sinus headaches, nasal congestion and a mild cough.  She also was having a bit of dental pain but believes this is from sinuses potentially.  She denies any rash of her face or significant headache.  She denies any fevers, vomiting or diarrhea.  No known sick contacts.  She has trialed Tylenol for symptoms with some relief. Past Medical History:  Diagnosis Date   Refusal of blood transfusions as patient is Jehovah's Witness     Patient Active Problem List   Diagnosis Date Noted   Diabetes mellitus without complication (HCC) 03/06/2021   Right sciatic nerve pain 03/06/2021    Past Surgical History:  Procedure Laterality Date   TUBAL LIGATION     14 years ago    OB History   No obstetric history on file.      Home Medications    Prior to Admission medications   Medication Sig Start Date End Date Taking? Authorizing Provider  azaTHIOprine (IMURAN) 50 MG tablet Take by mouth. 01/30/21   [provider]  Blood Glucose Monitoring Suppl (GLUCOCOM BLOOD GLUCOSE MONITOR) DEVI Use to check BS daily- E11.9 04/07/20   [provider]  Insulin Pen Needle (B-D UF III MINI PEN NEEDLES) 31G X 5 MM MISC USE TO INJECT VICTOZA AS DIRECTED 04/10/20   [provider]  Lancets (ACCU-CHEK MULTICLIX) lancets Check BS fasting in the AM and 2 hrs after evening meal 03/10/19   [provider]  VICTOZA 18 MG/3ML SOPN Inject 1.8 mg into the skin daily. 03/06/21 06/04/21  Autry-Lott, Randa Evens, DO    Family History Family History  Problem Relation Age of Onset   Diabetes Maternal Grandmother    Cancer Maternal Grandmother         neck   High blood pressure Maternal Grandmother    Diabetes Maternal Grandfather    High blood pressure Maternal Grandfather    Cancer Maternal Grandfather    High blood pressure Paternal Grandmother    High blood pressure Paternal Grandfather     Social History Social History   Tobacco Use   Smoking status: Never   Smokeless tobacco: Never  Substance Use Topics   Alcohol use: Yes    Comment: wine occasionally   Drug use: No     Allergies   Patient has no known allergies.   Review of Systems Review of Systems  As stated above in HPI Physical Exam Triage Vital Signs ED Triage Vitals  Enc Vitals Group     BP 04/11/21 1233 125/83     Pulse Rate 04/11/21 1233 80     Resp 04/11/21 1233 18     Temp 04/11/21 1235 98.9 F (37.2 C)     Temp Source 04/11/21 1233 Oral     SpO2 04/11/21 1233 100 %     Weight --      Height --      Head Circumference --      Peak Flow --      Pain Score 04/11/21 1234 10     Pain Loc --  Pain Edu? --      Excl. in GC? --    No data found.  Updated Vital Signs BP 125/83 (BP Location: Right Arm)   Pulse 80   Temp 98.9 F (37.2 C)   Resp 18   SpO2 100%   Physical Exam Vitals and nursing note reviewed.  Constitutional:      General: She is not in acute distress.    Appearance: She is well-developed. She is not ill-appearing, toxic-appearing or diaphoretic.     Comments: NO rash of face.  HENT:     Head: Normocephalic and atraumatic.     Mouth/Throat:     Mouth: Mucous membranes are moist.     Pharynx: Oropharynx is clear.     Comments: No abnormal dentition. NO sinus bogginess.  Eyes:     General: No scleral icterus.    Extraocular Movements: Extraocular movements intact.     Right eye: Normal extraocular motion and no nystagmus.     Left eye: Normal extraocular motion and no nystagmus.     Pupils: Pupils are equal, round, and reactive to light. Pupils are equal.     Right eye: Pupil is round and reactive.     Left  eye: Pupil is round and reactive.  Cardiovascular:     Rate and Rhythm: Normal rate and regular rhythm.     Heart sounds: Normal heart sounds.  Pulmonary:     Effort: Pulmonary effort is normal.     Breath sounds: Normal breath sounds.  Musculoskeletal:     Cervical back: Normal range of motion and neck supple.  Lymphadenopathy:     Cervical: No cervical adenopathy.  Skin:    General: Skin is warm.  Neurological:     Mental Status: She is alert and oriented to person, place, and time.     UC Treatments / Results  Labs (all labs ordered are listed, but only abnormal results are displayed) Labs Reviewed - No data to display  EKG   Radiology No results found.  Procedures Procedures (including critical care time)  Medications Ordered in UC Medications - No data to display  Initial Impression / Assessment and Plan / UC Course  I have reviewed the triage vital signs and the nursing notes.  Pertinent labs & imaging results that were available during my care of the patient were reviewed by me and considered in my medical decision making (see chart for details).     New.  Patient describes fairly significant sinus congestion and tenderness.  There does not appear to be a sinus infection but rather sinus inflammation.  I do not suspect that just Flonase will help with her pain so we discussed prednisone which she states has worked well for her in the past.  Due to her diabetic status we are going to use low-dose prednisone.  She states that her sugars have been "" good and normal" recently.  Reviewed how to use.  Reviewed common potential side effects and precautions.  Follow-up PRN.  Final Clinical Impressions(s) / UC Diagnoses   Final diagnoses:  None   Discharge Instructions   None    ED Prescriptions   None    PDMP not reviewed this encounter.   Rushie Chestnut, [place] 04/11/21 1320

## 2021-04-11 NOTE — ED Triage Notes (Signed)
Pt is present today with a cough, nasal congestion, sinus pressure, and HA. Pt states that her sx started one week ago

## 2021-06-04 ENCOUNTER — Other Ambulatory Visit: Payer: Self-pay | Admitting: Family Medicine

## 2021-06-04 DIAGNOSIS — E119 Type 2 diabetes mellitus without complications: Secondary | ICD-10-CM

## 2021-11-02 ENCOUNTER — Emergency Department (HOSPITAL_BASED_OUTPATIENT_CLINIC_OR_DEPARTMENT_OTHER)
Admission: EM | Admit: 2021-11-02 | Discharge: 2021-11-02 | Disposition: A | Discharge status: Home / Self Care | Payer: Medicaid Other | Attending: Emergency Medicine | Admitting: Emergency Medicine

## 2021-11-02 ENCOUNTER — Encounter (HOSPITAL_BASED_OUTPATIENT_CLINIC_OR_DEPARTMENT_OTHER): Payer: Self-pay | Admitting: Obstetrics and Gynecology

## 2021-11-02 ENCOUNTER — Other Ambulatory Visit: Payer: Self-pay

## 2021-11-02 DIAGNOSIS — R1031 Right lower quadrant pain: Secondary | ICD-10-CM | POA: Diagnosis not present

## 2021-11-02 DIAGNOSIS — R1030 Lower abdominal pain, unspecified: Secondary | ICD-10-CM | POA: Diagnosis not present

## 2021-11-02 DIAGNOSIS — R109 Unspecified abdominal pain: Secondary | ICD-10-CM

## 2021-11-02 LAB — URINALYSIS, ROUTINE W REFLEX MICROSCOPIC
Bilirubin Urine: NEGATIVE
Glucose, UA: NEGATIVE mg/dL
Hgb urine dipstick: NEGATIVE
Ketones, ur: NEGATIVE mg/dL
Nitrite: POSITIVE — AB
Protein, ur: NEGATIVE mg/dL
Specific Gravity, Urine: 1.018 (ref 1.005–1.030)
pH: 5.5 (ref 5.0–8.0)

## 2021-11-02 LAB — CBC
HCT: 41.9 % (ref 36.0–46.0)
Hemoglobin: 12.8 g/dL (ref 12.0–15.0)
MCH: 26.1 pg (ref 26.0–34.0)
MCHC: 30.5 g/dL (ref 30.0–36.0)
MCV: 85.3 fL (ref 80.0–100.0)
Platelets: 187 10*3/uL (ref 150–400)
RBC: 4.91 MIL/uL (ref 3.87–5.11)
RDW: 14.7 % (ref 11.5–15.5)
WBC: 4.8 10*3/uL (ref 4.0–10.5)
nRBC: 0 % (ref 0.0–0.2)

## 2021-11-02 LAB — COMPREHENSIVE METABOLIC PANEL
ALT: 7 U/L (ref 0–44)
AST: 20 U/L (ref 15–41)
Albumin: 4.3 g/dL (ref 3.5–5.0)
Alkaline Phosphatase: 70 U/L (ref 38–126)
Anion gap: 9 (ref 5–15)
BUN: 8 mg/dL (ref 6–20)
CO2: 25 mmol/L (ref 22–32)
Calcium: 9.4 mg/dL (ref 8.9–10.3)
Chloride: 102 mmol/L (ref 98–111)
Creatinine, Ser: 0.91 mg/dL (ref 0.44–1.00)
GFR, Estimated: >60 mL/min (ref >60)
Glucose, Bld: 85 mg/dL (ref 70–99)
Potassium: 3.9 mmol/L (ref 3.5–5.1)
Sodium: 136 mmol/L (ref 135–145)
Total Bilirubin: 0.7 mg/dL (ref 0.3–1.2)
Total Protein: 8.1 g/dL (ref 6.5–8.1)

## 2021-11-02 LAB — LIPASE, BLOOD: Lipase: 28 U/L (ref 11–51)

## 2021-11-02 LAB — PREGNANCY, URINE: Preg Test, Ur: NEGATIVE

## 2021-11-02 NOTE — ED Notes (Signed)
Pt requesting D/c paperwork. Provider made aware.

## 2021-11-02 NOTE — Discharge Instructions (Signed)
Follow-up with your OB/GYN doctor as planned.  Return to the emergency room for fever vomiting worsening symptoms.  Try taking over-the-counter medication such as ibuprofen or Naprosyn for pain and discomfort

## 2021-11-02 NOTE — ED Notes (Signed)
EMT-P provided AVS using Teachback Method. Patient verbalizes understanding of Discharge Instructions. Opportunity for Questioning and Answers were provided by EMT-P. Patient Discharged from ED.  ? ?

## 2021-11-02 NOTE — ED Provider Notes (Signed)
[place] EMERGENCY DEPT Provider Note   CSN: [id number] Arrival date & time: 11/02/21  1615     History  Chief Complaint  Patient presents with   Abdominal Pain    Emily Glenn is a 46 y.o. female.   Abdominal Pain  Patient states she started having pain in the lower abdomen mostly on the right side about 4 days ago.  Pain is constant and aching.  She denies any trouble with burning or pain with urination.  She is not having any nausea vomiting or diarrhea.  She denies any trouble with blood in her stool.  Patient states she has tried taking Tylenol with relief.  Home Medications Prior to Admission medications   Medication Sig Start Date End Date Taking? Authorizing Provider  azaTHIOprine (IMURAN) 50 MG tablet Take by mouth. 01/30/21   [provider]  benzonatate (TESSALON) 100 MG capsule Take 1 capsule (100 mg total) by mouth every 8 (eight) hours. 04/11/21   Hughie Closs, PA-C  Blood Glucose Monitoring Suppl (GLUCOCOM BLOOD GLUCOSE MONITOR) DEVI Use to check BS daily- E11.9 04/07/20   [provider]  fluticasone (FLONASE) 50 MCG/ACT nasal spray Place 2 sprays into both nostrils daily. 04/11/21   Hughie Closs, PA-C  Insulin Pen Needle (B-D UF III MINI PEN NEEDLES) 31G X 5 MM MISC USE TO INJECT VICTOZA AS DIRECTED 04/10/20   [provider]  Lancets (ACCU-CHEK MULTICLIX) lancets Check BS fasting in the AM and 2 hrs after evening meal 03/10/19   [provider]  predniSONE (DELTASONE) 10 MG tablet Take 4 tablets by mouth with breakfast for 2 days, 2 tablets by mouth for 2 days and 1 tablet by mouth for 2 days. 04/11/21   Hughie Closs, PA-C  VICTOZA 18 MG/3ML SOPN ADMINISTER 1.8 MG UNDER THE SKIN DAILY 06/04/21   Autry-Lott, Naaman Plummer, DO      Allergies    Patient has no known allergies.    Review of Systems   Review of Systems  Gastrointestinal:  Positive for abdominal pain.   Physical Exam Updated Vital Signs BP  130/78    Pulse 77    Temp 98.1 F (36.7 C)    Resp 18    Ht 1.626 m (5\' 4" )    Wt 74.8 kg    LMP 10/18/2021 (Approximate)    SpO2 100%    BMI 28.32 kg/m  Physical Exam  ED Results / Procedures / Treatments   Labs (all labs ordered are listed, but only abnormal results are displayed) Labs Reviewed  URINALYSIS, ROUTINE W REFLEX MICROSCOPIC - Abnormal; Notable for the following components:      Result Value   APPearance HAZY (*)    Nitrite POSITIVE (*)    Leukocytes,Ua SMALL (*)    Bacteria, UA RARE (*)    All other components within normal limits  WET PREP, GENITAL  LIPASE, BLOOD  COMPREHENSIVE METABOLIC PANEL  CBC  PREGNANCY, URINE  GC/CHLAMYDIA PROBE AMP ([place]) NOT AT [facility]    EKG None  Radiology No results found.  Procedures Procedures    Medications Ordered in ED Medications - No data to display  ED Course/ Medical Decision Making/ A&P Clinical Course as of 11/04/21 1857  Fri Nov 02, 2021  1854 CBC normal.  CMET normal.  UA with nitrite but not definitive for UTI.  No UTI sx.    With her lower abd pain, discussed pelvic exam for further evaluation [JK]  1927 Patient states she  just got a call from her kids and she needs to leave.  I had planned on doing a pelvic exam [JK]    Clinical Course User Index [JK] Dorie Rank, MD                           Medical Decision Making  Pt with lower abd pain.   Abd exam benign.  Doubt appendicitis, colitis, diverticulitis.  Possible uterine, ovarian etiology.  Pt unfortunately not able to stay to complete evaluation, pelvic exam.  Warning signs precautions discussed.  Recommend close outpt follow up or return to ED to complete evaluation.         Final Clinical Impression(s) / ED Diagnoses Final diagnoses:  Abdominal pain, unspecified abdominal location    Rx / DC Orders ED Discharge Orders     None         Dorie Rank, MD 11/04/21 1857

## 2021-11-02 NOTE — ED Triage Notes (Signed)
Patient reports to the ER for lower abdominal pain. Patient states she started having the pain x4 days ago. Denies urinary symptoms, denies N/V/D. Patient blood in stool.

## 2021-11-05 ENCOUNTER — Ambulatory Visit: Payer: PRIVATE HEALTH INSURANCE

## 2021-11-09 ENCOUNTER — Ambulatory Visit: Payer: Medicaid Other

## 2021-11-09 NOTE — Patient Instructions (Incomplete)
It was nice seeing you today!  Blood work today.  See me in 3 months or whenever is a good for you.  Stay well, Devany Aja, MD [facility] [phone]  --  Make sure to check out at the front desk before you leave today.  Please arrive at least 15 minutes prior to your scheduled appointments.  If you had blood work today, I will send you a MyChart message or a letter if results are normal. Otherwise, I will give you a call.  If you had a referral placed, they will call you to set up an appointment. Please give us a call if you don't hear back in the next 2 weeks.  If you need additional refills before your next appointment, please call your pharmacy first.  

## 2021-11-09 NOTE — Progress Notes (Deleted)
° ° °  SUBJECTIVE:   CHIEF COMPLAINT / HPI: Abdominal pain  ***  PERTINENT  PMH / PSH: T2DM  OBJECTIVE:   LMP 10/18/2021 (Approximate)   General: ***, NAD CV: RRR, no murmurs*** Pulm: CTAB, no wheezes or rales   ASSESSMENT/PLAN:   No problem-specific Assessment & Plan notes found for this encounter.     Emily Button, MD [place]   {    This will disappear when note is signed, click to select method of visit    :1}

## 2021-11-11 NOTE — Progress Notes (Signed)
° ° °  SUBJECTIVE:   CHIEF COMPLAINT / HPI:   Abdominal Pain  Emily Glenn is a 46 y.o. female who presents to the [facility] clinic with concerns of abdominal pain. Seen in the ED on 1/6 for lower abdominal pain. Workup in ED with normal CBC, CMP, lipase. UA with nitrite, small leukocytes and rare bacteria. Pelvic exam planned but patient had to leave prior to evaluation. States that her abdominal pain has resolved two days later.   Weight Loss States she has history of scoliosis. She would like to have surgery for breast reduction to help take strain off her back. Weight goal is 130 lbs for surgery. States that she was able to drop to 140 lbs but was in "so severe" pain that she ended up gaining her weight back. Notes that Medicaid won't pay for the breast reduction surgery unless she has breast cancer. She is trying to save up for this surgery.   Diet: chicken, fruits and vegetable. Eats out about every 2-3 weeks.   PERTINENT  PMH / PSH: DM   OBJECTIVE:   BP 110/70    Pulse 78    Ht 5\' 4"  (1.626 m)    Wt 170 lb 12.8 oz (77.5 kg)    LMP 10/18/2021 (Approximate)    SpO2 100%    BMI 29.32 kg/m    General: NAD, overweight, macromastia, pleasant, able to participate in exam Respiratory: Breathing comfortably, normal effort Abdomen: Overweight Back: Unable to appreciate scoliosis with hip flexion, no asymmetric hump of shoulders/upper back  Skin: warm and dry Psych: Normal affect and mood  ASSESSMENT/PLAN:   Macromastia Interested in surgical reduction as she is having back pain and has recurrent yeast infections under her breast (come and go, currently absent). Reports that she has difficulty with losing weight as this causes her back pain to be even worse. She is still interested in weight loss. -Contact information for Dr. Jenne Campus provided -Continue Victoza that was previously prescribed -Encouraged her to contact surgeon for discussion about proceeding with breast reduction surgery -Discussed  nutritional and exercise recommendations for weight loss  Abdominal pain Self resolved after ED visit.  -No further work up  Encounter for screening colonoscopy Due for screening colonoscopy -Ambulatory referral to GI placed today    Sharion Settler, [place]

## 2021-11-11 NOTE — Patient Instructions (Addendum)
It was wonderful to see you today.  Today we talked about:  For weight management: Call Dr. Gerilyn Pilgrim (our nutritionist) to set up an appointment. Her phone number is: [phone].  I would encourage you to call the surgeon that you saw and discuss your concerns. I do think you would benefit from breast reduction.  I sent a referral to GI for a screening colonoscopy. They will call you with an appointment.  Thank you for choosing [facility].   Please call [phone] with any questions about today's appointment.  Please be sure to schedule follow up at the front  desk before you leave today.   Sabino Dick, DO PGY-2 Family Medicine

## 2021-11-13 ENCOUNTER — Other Ambulatory Visit: Payer: Self-pay

## 2021-11-13 ENCOUNTER — Encounter: Payer: Self-pay | Admitting: Family Medicine

## 2021-11-13 ENCOUNTER — Ambulatory Visit: Payer: Medicaid Other | Admitting: Family Medicine

## 2021-11-13 VITALS — BP 110/70 | HR 78 | Ht 64.0 in | Wt 170.8 lb

## 2021-11-13 DIAGNOSIS — R109 Unspecified abdominal pain: Secondary | ICD-10-CM

## 2021-11-13 DIAGNOSIS — Z1211 Encounter for screening for malignant neoplasm of colon: Secondary | ICD-10-CM | POA: Diagnosis not present

## 2021-11-13 DIAGNOSIS — N62 Hypertrophy of breast: Secondary | ICD-10-CM | POA: Insufficient documentation

## 2021-11-13 NOTE — Assessment & Plan Note (Signed)
Interested in surgical reduction as she is having back pain and has recurrent yeast infections under her breast (come and go, currently absent). Reports that she has difficulty with losing weight as this causes her back pain to be even worse. She is still interested in weight loss. -Contact information for Dr. Jenne Campus provided -Continue Victoza that was previously prescribed -Encouraged her to contact surgeon for discussion about proceeding with breast reduction surgery -Discussed nutritional and exercise recommendations for weight loss

## 2021-11-13 NOTE — Assessment & Plan Note (Signed)
Due for screening colonoscopy -Ambulatory referral to GI placed today

## 2021-11-13 NOTE — Assessment & Plan Note (Signed)
Self resolved after ED visit.  -No further work up

## 2021-11-21 DIAGNOSIS — Z794 Long term (current) use of insulin: Secondary | ICD-10-CM | POA: Diagnosis not present

## 2021-11-21 DIAGNOSIS — H209 Unspecified iridocyclitis: Secondary | ICD-10-CM | POA: Diagnosis not present

## 2021-11-21 DIAGNOSIS — E119 Type 2 diabetes mellitus without complications: Secondary | ICD-10-CM | POA: Diagnosis not present

## 2022-01-07 ENCOUNTER — Ambulatory Visit (INDEPENDENT_AMBULATORY_CARE_PROVIDER_SITE_OTHER): Payer: Medicaid Other | Admitting: Family Medicine

## 2022-01-07 ENCOUNTER — Other Ambulatory Visit: Payer: Self-pay

## 2022-01-07 ENCOUNTER — Ambulatory Visit (HOSPITAL_COMMUNITY)
Admission: RE | Admit: 2022-01-07 | Discharge: 2022-01-07 | Disposition: A | Discharge status: Home / Self Care | Payer: Medicaid Other | Source: Ambulatory Visit | Attending: Family Medicine | Admitting: Family Medicine

## 2022-01-07 ENCOUNTER — Encounter: Payer: Self-pay | Admitting: Family Medicine

## 2022-01-07 VITALS — BP 107/79 | HR 83 | Ht 64.0 in

## 2022-01-07 DIAGNOSIS — R29898 Other symptoms and signs involving the musculoskeletal system: Secondary | ICD-10-CM | POA: Insufficient documentation

## 2022-01-07 DIAGNOSIS — M545 Low back pain, unspecified: Secondary | ICD-10-CM | POA: Diagnosis not present

## 2022-01-07 DIAGNOSIS — M5432 Sciatica, left side: Secondary | ICD-10-CM | POA: Diagnosis not present

## 2022-01-07 MED ORDER — NAPROXEN 250 MG PO TABS: 500.0000 mg | ORAL_TABLET | Freq: Two times a day (BID) | ORAL | 0 refills | Status: AC

## 2022-01-07 MED ORDER — CYCLOBENZAPRINE HCL 10 MG PO TABS: 10.0000 mg | ORAL_TABLET | Freq: Three times a day (TID) | ORAL | 0 refills | Status: DC | PRN

## 2022-01-07 NOTE — Progress Notes (Incomplete)
° ° °  SUBJECTIVE:   CHIEF COMPLAINT / HPI:   Left leg pain Started 2 days ago.   PERTINENT  PMH / PSH: ***  OBJECTIVE:   BP 107/79    Pulse 83    Ht 5\' 4"  (1.626 m)    SpO2 100%    BMI 29.32 kg/m   Physical Exam   ASSESSMENT/PLAN:   No problem-specific Assessment & Plan notes found for this encounter. 1. Left sciatic nerve pain *** - cyclobenzaprine (FLEXERIL) 10 MG tablet; Take 1 tablet (10 mg total) by mouth 3 (three) times daily as needed for muscle spasms.  Dispense: 30 tablet; Refill: 0 - naproxen (NAPROSYN) 250 MG tablet; Take 2 tablets (500 mg total) by mouth 2 (two) times daily with a meal for 10 days.  Dispense: 40 tablet; Refill: 0 - Ambulatory referral to Physical Therapy  2. Left leg weakness *** - DG Lumbar Spine Complete; Future      , DO [facility]

## 2022-01-07 NOTE — Patient Instructions (Addendum)
It was wonderful to see you today. ? ?Please bring ALL of your medications with you to every visit.  ? ?Today we talked about: ? ?Left leg sciatic nerve pain and weakness.  For this we will get lumbar imaging.  I have prescribed naproxen and Flexeril.  I have also referred you to physical therapy.  Follow-up in 2 weeks. ? ?Please be sure to schedule follow up at the front  desk before you leave today.  ? ?If you haven't already, sign up for My Chart to have easy access to your labs results, and communication with your primary care physician. ? ?Please call the clinic at [phone] if your symptoms worsen or you have any concerns. It was our pleasure to serve you. ? ?Dr. Janus Molder ? ?

## 2022-01-14 ENCOUNTER — Other Ambulatory Visit: Payer: Self-pay | Admitting: Family Medicine

## 2022-01-14 DIAGNOSIS — M5432 Sciatica, left side: Secondary | ICD-10-CM

## 2022-03-21 DIAGNOSIS — E119 Type 2 diabetes mellitus without complications: Secondary | ICD-10-CM | POA: Diagnosis not present

## 2022-03-21 DIAGNOSIS — Z794 Long term (current) use of insulin: Secondary | ICD-10-CM | POA: Diagnosis not present

## 2022-03-21 DIAGNOSIS — H209 Unspecified iridocyclitis: Secondary | ICD-10-CM | POA: Diagnosis not present

## 2022-04-02 ENCOUNTER — Encounter: Payer: Self-pay | Admitting: *Deleted

## 2022-04-02 DIAGNOSIS — Z794 Long term (current) use of insulin: Secondary | ICD-10-CM | POA: Diagnosis not present

## 2022-04-02 DIAGNOSIS — E119 Type 2 diabetes mellitus without complications: Secondary | ICD-10-CM | POA: Diagnosis not present

## 2022-04-02 DIAGNOSIS — H209 Unspecified iridocyclitis: Secondary | ICD-10-CM | POA: Diagnosis not present

## 2022-04-08 DIAGNOSIS — E119 Type 2 diabetes mellitus without complications: Secondary | ICD-10-CM | POA: Diagnosis not present

## 2022-04-08 DIAGNOSIS — H209 Unspecified iridocyclitis: Secondary | ICD-10-CM | POA: Diagnosis not present

## 2022-04-08 DIAGNOSIS — Z794 Long term (current) use of insulin: Secondary | ICD-10-CM | POA: Diagnosis not present

## 2022-04-15 DIAGNOSIS — Z794 Long term (current) use of insulin: Secondary | ICD-10-CM | POA: Diagnosis not present

## 2022-04-15 DIAGNOSIS — H209 Unspecified iridocyclitis: Secondary | ICD-10-CM | POA: Diagnosis not present

## 2022-04-15 DIAGNOSIS — E119 Type 2 diabetes mellitus without complications: Secondary | ICD-10-CM | POA: Diagnosis not present

## 2022-04-16 DIAGNOSIS — Z7985 Long-term (current) use of injectable non-insulin antidiabetic drugs: Secondary | ICD-10-CM | POA: Diagnosis not present

## 2022-04-16 DIAGNOSIS — H44113 Panuveitis, bilateral: Secondary | ICD-10-CM | POA: Diagnosis not present

## 2022-04-16 DIAGNOSIS — Z79899 Other long term (current) drug therapy: Secondary | ICD-10-CM | POA: Diagnosis not present

## 2022-04-16 DIAGNOSIS — H3581 Retinal edema: Secondary | ICD-10-CM | POA: Diagnosis not present

## 2022-04-16 DIAGNOSIS — H44112 Panuveitis, left eye: Secondary | ICD-10-CM | POA: Diagnosis not present

## 2022-04-16 DIAGNOSIS — E119 Type 2 diabetes mellitus without complications: Secondary | ICD-10-CM | POA: Diagnosis not present

## 2022-04-17 ENCOUNTER — Ambulatory Visit: Payer: Medicaid Other | Admitting: Family Medicine

## 2022-04-17 VITALS — BP 118/74 | HR 105 | Temp 103.0°F | Ht 64.0 in | Wt 177.1 lb

## 2022-04-17 DIAGNOSIS — J069 Acute upper respiratory infection, unspecified: Secondary | ICD-10-CM

## 2022-04-17 IMAGING — CR DG LUMBAR SPINE COMPLETE 4+V
5 series · 5 of 5 positions shown · non-contrast
Comparison: 08/04/2018.

CLINICAL DATA: Bilateral leg weakness, left sided sciatic pain.

EXAM:
LUMBAR SPINE - COMPLETE 4+ VIEW

[l-spine ap]
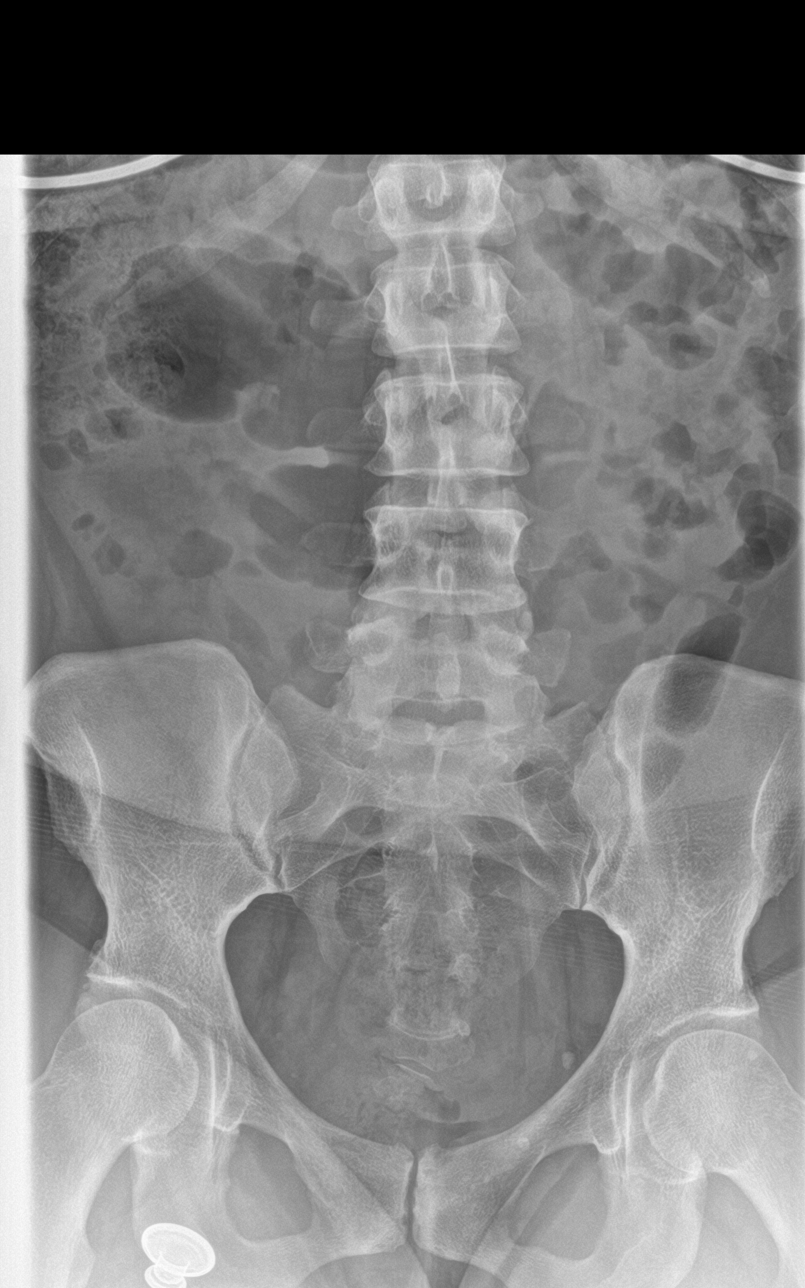

[l-spine obl (1 of 2)]
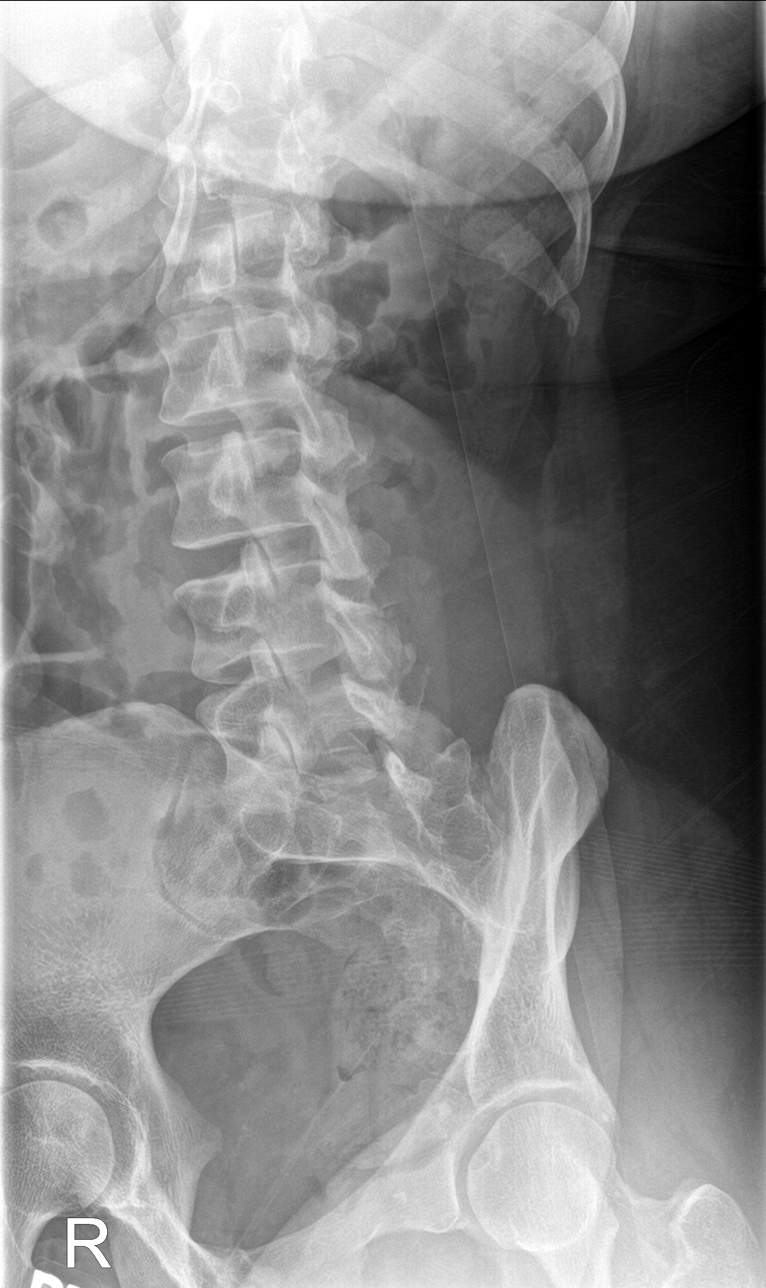

[l-spine obl (2 of 2)]
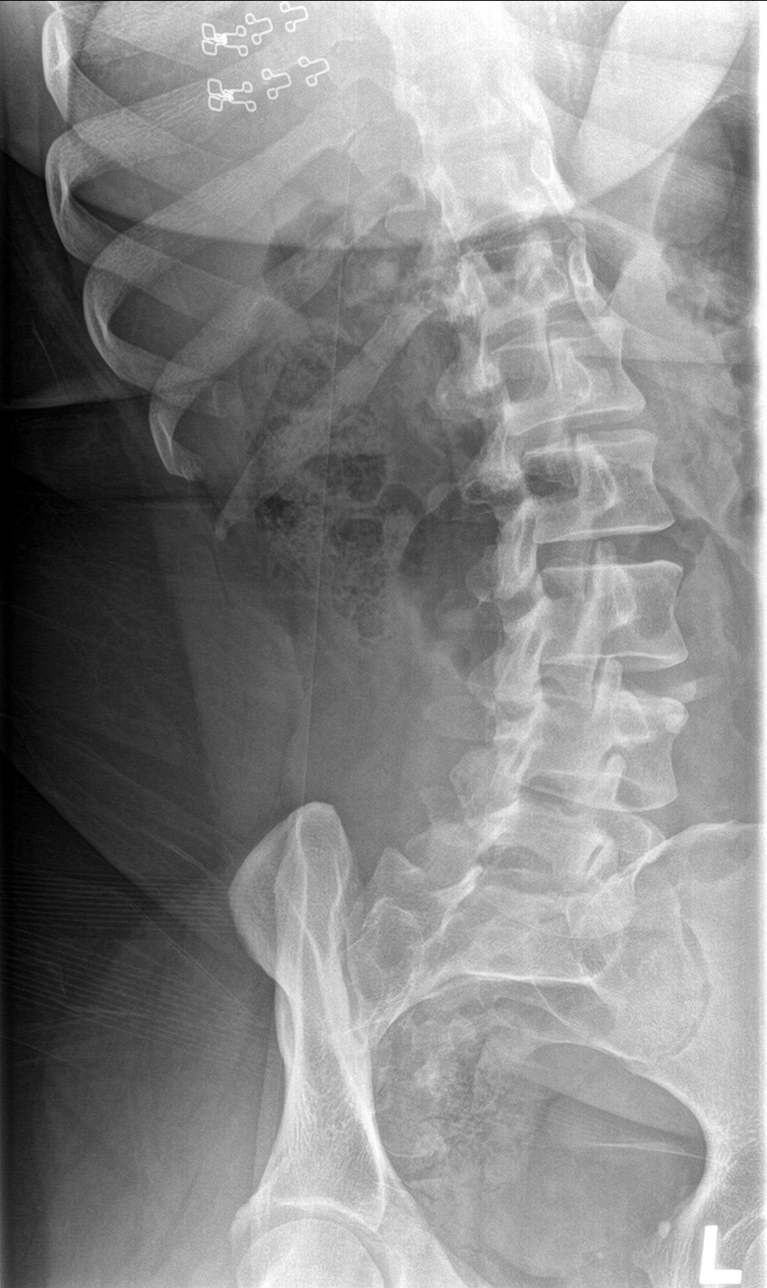

[l-spine lat]
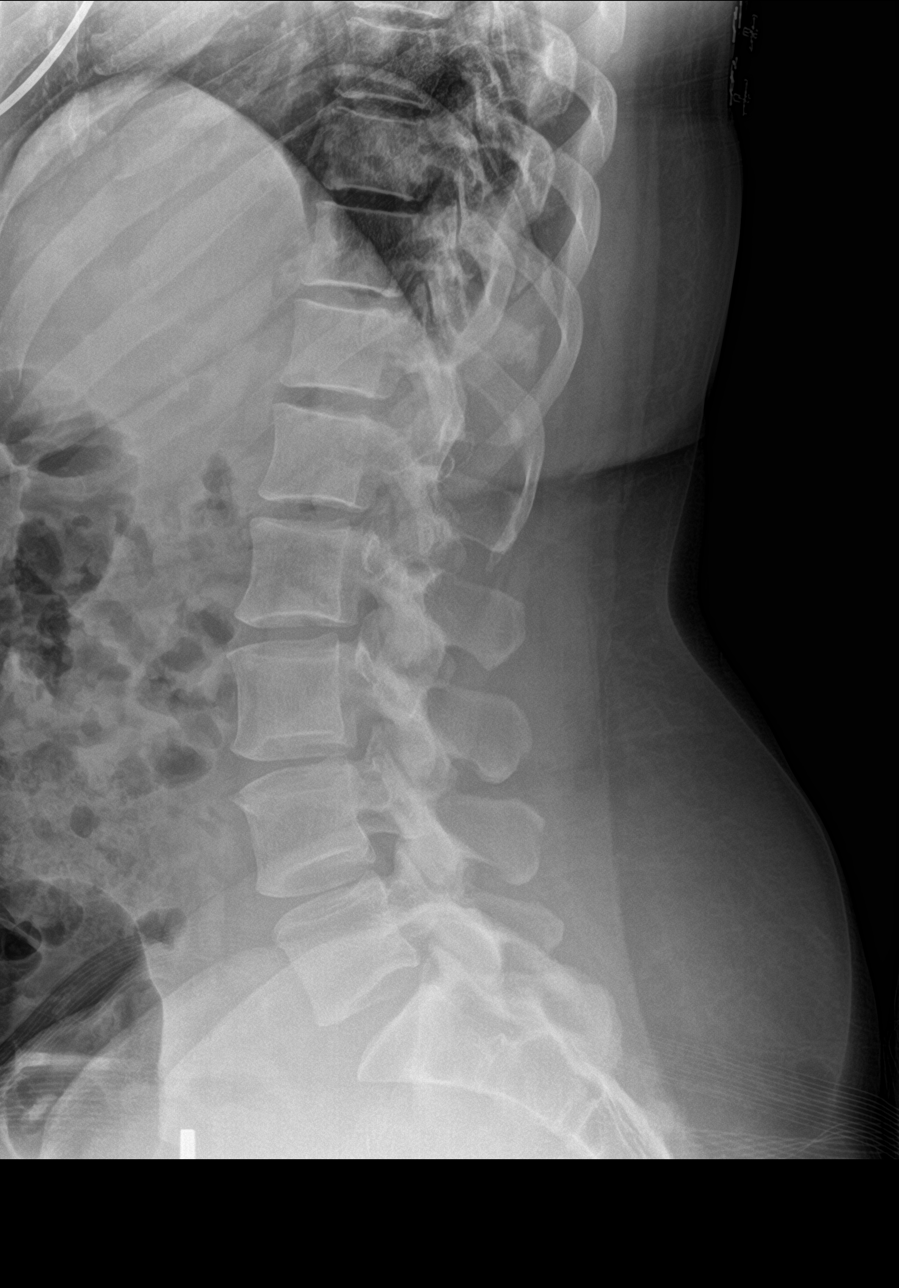

[l-spine spot]
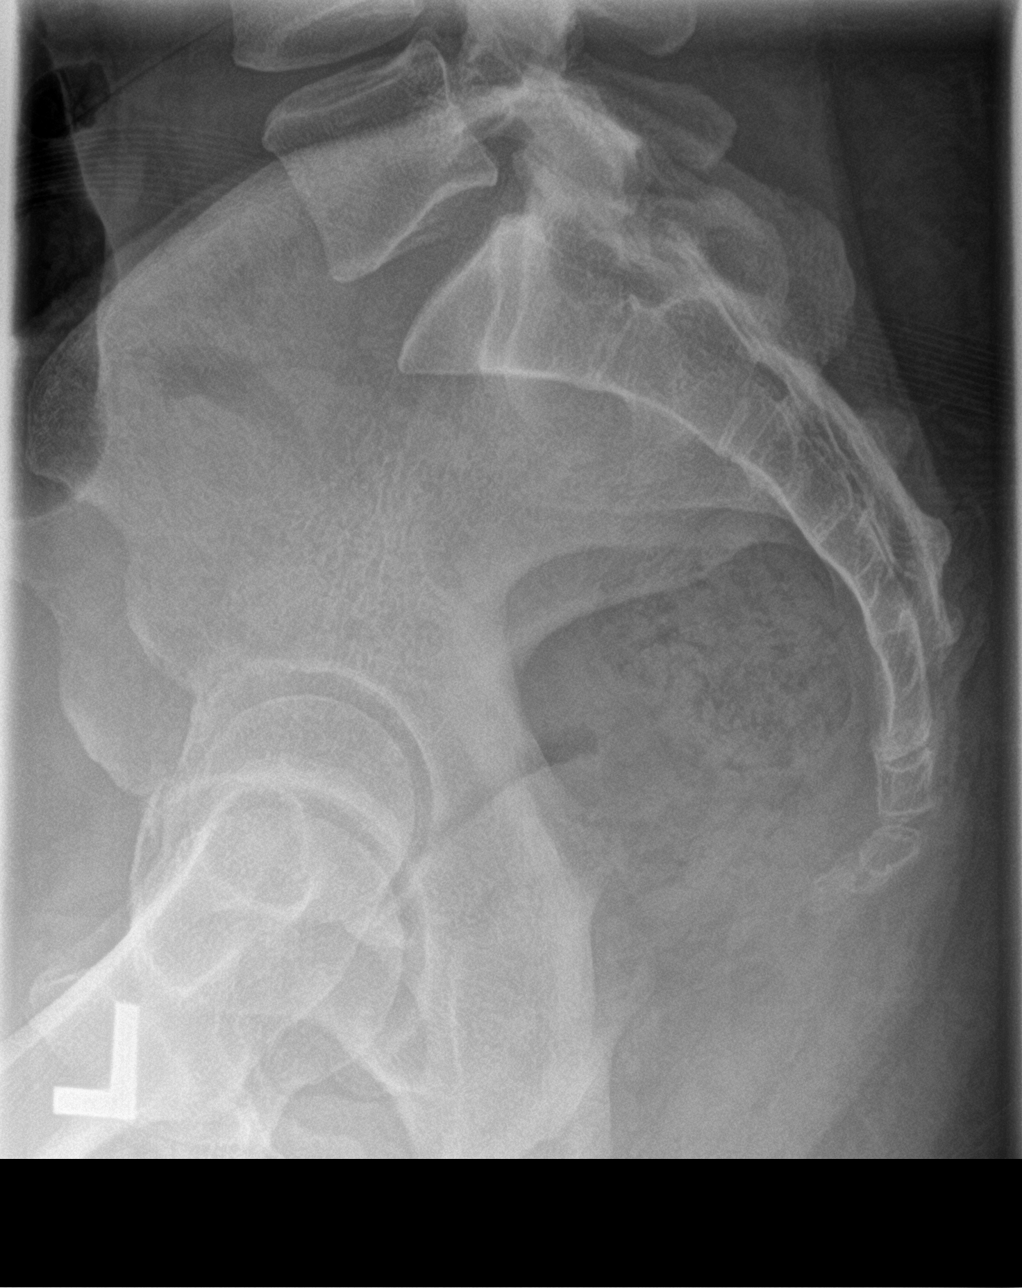

[5 of 5 positions shown; findings below may reference images not displayed]

FINDINGS: There is no evidence of lumbar spine fracture. Alignment is normal.
Intervertebral disc spaces are maintained. Mild endplate osteophyte
formation and facet hypertrophy noted in the mid to lower lumbar
spine.
IMPRESSION: Mild degenerative changes in the mid to lower lumbar spine.

## 2022-04-17 MED ORDER — IBUPROFEN 200 MG PO TABS
400.0000 mg | ORAL_TABLET | Freq: Once | ORAL | Status: AC
  Administered 2022-04-17: 400 mg via ORAL

## 2022-04-17 NOTE — Patient Instructions (Addendum)
It was a pleasure seeing you today.  I am sorry you are having all of the symptoms.  This is likely caused by a virus and so want you to continue treatment with alternating Tylenol and ibuprofen to help with the fever and body aches.  You can use hot tea with honey for sore throat.  Please be sure to drink plenty of fluids.  Please keep an eye on your blood sugars because with infections your blood sugars can get out of control.  We are testing for COVID as well as a flu.  If you come back positive for COVID you would qualify for a treatment with Paxlovid which I will call into your pharmacy pending the results.  If your symptoms worsen please be evaluated at the emergency department immediately.  If you have any questions or concerns call the clinic.  I hope you get to feeling better!

## 2022-04-17 NOTE — Progress Notes (Unsigned)
    SUBJECTIVE:   CHIEF COMPLAINT / HPI:   Viral URI 2 days Cough, congestion, runny nose, fever, body aches, shortness of breath  PERTINENT  PMH / PSH: ***  OBJECTIVE:   BP 118/74   Pulse (!) 105   Temp (!) 103 F (39.4 C) (Oral)   Ht 5\' 4"  (1.626 m)   Wt 177 lb 2 oz (80.3 kg)   LMP 03/30/2022   SpO2 99%   BMI 30.40 kg/m   ***  ASSESSMENT/PLAN:   No problem-specific Assessment & Plan notes found for this encounter.     05/30/2022, MD [facility]

## 2022-04-18 DIAGNOSIS — J069 Acute upper respiratory infection, unspecified: Secondary | ICD-10-CM | POA: Insufficient documentation

## 2022-04-18 HISTORY — DX: Acute upper respiratory infection, unspecified: J06.9

## 2022-04-18 NOTE — Assessment & Plan Note (Signed)
Patient with 2-day history of symptoms of viral URI.  Discussed symptomatic treatment such as Tylenol for fever or pain, OTC medications, hot tea with honey for sore throat, adequate hydration.  We will test COVID but this patient is candidate for Paxlovid if positive.  Strict ED and return precautions given.

## 2022-04-19 ENCOUNTER — Encounter: Payer: Self-pay | Admitting: Family Medicine

## 2022-04-19 ENCOUNTER — Telehealth: Payer: Self-pay | Admitting: Family Medicine

## 2022-04-19 LAB — COVID-19, FLU A+B NAA
Influenza A, NAA: NOT DETECTED
Influenza B, NAA: NOT DETECTED
SARS-CoV-2, NAA: DETECTED — AB

## 2022-04-19 LAB — HM DIABETES EYE EXAM

## 2022-04-19 MED ORDER — NIRMATRELVIR/RITONAVIR (PAXLOVID)TABLET: 3.0000 | ORAL_TABLET | Freq: Two times a day (BID) | ORAL | 0 refills | Status: AC

## 2022-09-03 ENCOUNTER — Ambulatory Visit: Payer: Medicaid Other | Admitting: Student

## 2022-09-04 ENCOUNTER — Other Ambulatory Visit: Payer: Self-pay | Admitting: Family Medicine

## 2022-09-04 ENCOUNTER — Ambulatory Visit (INDEPENDENT_AMBULATORY_CARE_PROVIDER_SITE_OTHER): Payer: Medicaid Other | Admitting: Family Medicine

## 2022-09-04 VITALS — BP 112/89 | HR 71

## 2022-09-04 DIAGNOSIS — E119 Type 2 diabetes mellitus without complications: Secondary | ICD-10-CM

## 2022-09-04 DIAGNOSIS — M25552 Pain in left hip: Secondary | ICD-10-CM | POA: Diagnosis not present

## 2022-09-04 LAB — POCT GLYCOSYLATED HEMOGLOBIN (HGB A1C): HbA1c, POC (controlled diabetic range): 5.7 % (ref 0.0–7.0)

## 2022-09-04 MED ORDER — TRAMADOL HCL 50 MG PO TABS: 50.0000 mg | ORAL_TABLET | Freq: Three times a day (TID) | ORAL | 0 refills | Status: AC | PRN

## 2022-09-04 MED ORDER — BLOOD GLUCOSE MONITOR KIT: PACK | 0 refills | Status: DC

## 2022-09-04 MED ORDER — NAPROXEN 500 MG PO TABS: 500.0000 mg | ORAL_TABLET | Freq: Two times a day (BID) | ORAL | 0 refills | Status: DC

## 2022-09-04 NOTE — Assessment & Plan Note (Signed)
Pain of lateral hip decreasing patient function walking and movement over past week.  Differential includes greater trochanteric pain syndrome, gluten medius strain, femoral fracture, abnormal presentation of hip osteoarthritis, sciatica.  Pain location profile more indicative of greater trochanteric pain syndrome versus other acute MSK pathology typically requiring trauma or overuse.  Possible sciatic component however current pain not have radiculopathic pattern. -Plan to treat conservatively with follow-up -Naprosyn 500 mg twice daily for 7 days -Tramadol as needed every 8 hours for breakthrough pain -Physical therapy referral -Follow-up 1 month if pain is still persisting consider steroid injection at that time

## 2022-09-04 NOTE — Patient Instructions (Signed)
It was great to see you! Thank you for allowing me to participate in your care!  I recommend that you always bring your medications to each appointment as this makes it easy to ensure we are on the correct medications and helps [place] not miss when refills are needed.  Our plans for today:  -Please take Naprosyn twice a day with meals.  For a total of 7 days.  If you are still having pain please take 1 pill of tramadol as needed. -You should receive a call to schedule physical therapy for your hip. -Please make a follow-up appointment with me in 4 weeks, if your symptoms do not improve. -Please follow-up in 4 weeks to discuss your diabetes.   Take care and seek immediate care sooner if you develop any concerns.   Dr. Celine Mans, MD [facility]

## 2022-09-04 NOTE — Progress Notes (Signed)
    SUBJECTIVE:   CHIEF COMPLAINT / HPI: back pain  T2DM - Last A1c 5.4 May of 2022. Patient taking Victoza. Not measuring at home. Symptoms of hypoglycemia, describes some shaking, and feelings of low blood sugar, does not check at home.   Sciatic nerve pain - Ongoing for years. Comes and goes every couple months. Tried muscle relaxers in the past, did not work. Hurts with walking and standing, and activity. States left leg locks up, sometimes right also locks up. Pain shoots down leg, throbbing, sharp. No numbness or burning. Some tingling in toes. Not used Voltaren gel. Has tried physical therapy, but couldn't do them due to pain. States she has a steroid injection back. No new falls or trauma.  PERTINENT  PMH / PSH: T2DM, Right sciatic nerve pain  OBJECTIVE:   BP 112/89   Pulse 71   SpO2 100%   General: NAD Neuro: A&O Respiratory: normal WOB on RA Back: No no lower lumbar paraspinal muscle tenderness, no vertebral body tenderness, no obvious bony deformity, no erythema or swelling, full range of motion of back Hip: Tender to palpation at greater trochanter on the left side as well as along gluteus minimus and medius muscles, decreased range of motion of internal and external rotation as well as flexion due to pain lateralizing to greater trochanter and hip abductors, pain lateralizing to same area on FADIR and FABER tests all on left side, straight leg raise negative Extremities: Moving all 4 extremities equally   ASSESSMENT/PLAN:   Diabetes mellitus without complication (HCC) Resent supplies to measure blood sugar at home to patient. Instructed to record and bring at next visit for follow-up in 4 weeks. Previous A1c 5.4, today 5.7.  -Consider reducing Victoza dose at follow-up visit -New glucose measuring supplies sent to pharmacy  Greater trochanteric pain syndrome of left lower extremity Pain of lateral hip decreasing patient function walking and movement over past week.   Differential includes greater trochanteric pain syndrome, gluten medius strain, femoral fracture, abnormal presentation of hip osteoarthritis, sciatica.  Pain location profile more indicative of greater trochanteric pain syndrome versus other acute MSK pathology typically requiring trauma or overuse.  Possible sciatic component however current pain not have radiculopathic pattern. -Plan to treat conservatively with follow-up -Naprosyn 500 mg twice daily for 7 days -Tramadol as needed every 8 hours for breakthrough pain -Physical therapy referral -Follow-up 1 month if pain is still persisting consider steroid injection at that time     Celine Mans, MD [facility]

## 2022-09-04 NOTE — Assessment & Plan Note (Addendum)
Resent supplies to measure blood sugar at home to patient. Instructed to record and bring at next visit for follow-up in 4 weeks. Previous A1c 5.4, today 5.7.  -Consider reducing Victoza dose at follow-up visit -New glucose measuring supplies sent to pharmacy

## 2022-09-06 LAB — MICROALBUMIN / CREATININE URINE RATIO
Creatinine, Urine: 276.8 mg/dL
Microalb/Creat Ratio: 7 mg/g creat (ref 0–29)
Microalbumin, Urine: 20.3 ug/mL

## 2022-09-22 ENCOUNTER — Other Ambulatory Visit: Payer: Self-pay | Admitting: Family Medicine

## 2022-09-22 DIAGNOSIS — M25552 Pain in left hip: Secondary | ICD-10-CM

## 2023-02-19 ENCOUNTER — Emergency Department (HOSPITAL_COMMUNITY)
Admission: EM | Admit: 2023-02-19 | Discharge: 2023-02-19 | Disposition: A | Discharge status: Home / Self Care | Payer: Medicaid Other | Attending: Student | Admitting: Student

## 2023-02-19 ENCOUNTER — Encounter (HOSPITAL_COMMUNITY): Payer: Self-pay | Admitting: Emergency Medicine

## 2023-02-19 DIAGNOSIS — M5431 Sciatica, right side: Secondary | ICD-10-CM | POA: Diagnosis not present

## 2023-02-19 DIAGNOSIS — Z794 Long term (current) use of insulin: Secondary | ICD-10-CM | POA: Diagnosis not present

## 2023-02-19 DIAGNOSIS — M549 Dorsalgia, unspecified: Secondary | ICD-10-CM | POA: Diagnosis present

## 2023-02-19 DIAGNOSIS — M5441 Lumbago with sciatica, right side: Secondary | ICD-10-CM | POA: Diagnosis not present

## 2023-02-19 DIAGNOSIS — E119 Type 2 diabetes mellitus without complications: Secondary | ICD-10-CM | POA: Diagnosis not present

## 2023-02-19 MED ORDER — CYCLOBENZAPRINE HCL 10 MG PO TABS: 10.0000 mg | ORAL_TABLET | Freq: Two times a day (BID) | ORAL | 0 refills | Status: DC | PRN

## 2023-02-19 MED ORDER — KETOROLAC TROMETHAMINE 30 MG/ML IJ SOLN
30.0000 mg | Freq: Once | INTRAMUSCULAR | Status: AC
  Administered 2023-02-19: 30 mg via INTRAMUSCULAR
  Filled 2023-02-19: qty 1

## 2023-02-19 MED ORDER — PREDNISONE 10 MG PO TABS: 30.0000 mg | ORAL_TABLET | Freq: Every day | ORAL | 0 refills | Status: AC

## 2023-02-19 NOTE — ED Provider Notes (Signed)
[place] [facility] DEPARTMENT AT [facility] Provider Note   CSN: [id number] Arrival date & time: 02/19/23  0217     History  Chief Complaint  Patient presents with   Back Pain    Emily Glenn is a 47 y.o. female.  HPI   Patient medical history including diabetes, presenting with complaints of sciatica.  Patient states that sciatica flared on Tuesday, states that she feels on her right side, feels pain from her buttocks down into her right leg, will occasional paresthesias in that leg, pain is worse with movements, as well as laying down in certain positions.  She denies any saddle paresthesias urinary or bowel incontinence disease, denies any falls, no back trauma, denies any urinary symptoms no dysuria hematuria no abdominal pain nausea vomiting no flank tenderness.  The patient states that she is dealing with sciatica for 3 years    Home Medications Prior to Admission medications   Medication Sig Start Date End Date Taking? Authorizing Provider  cyclobenzaprine (FLEXERIL) 10 MG tablet Take 1 tablet (10 mg total) by mouth 2 (two) times daily as needed for muscle spasms. 02/19/23  Yes Carroll Sage, PA-C  predniSONE (DELTASONE) 10 MG tablet Take 3 tablets (30 mg total) by mouth daily for 5 days. 02/19/23 02/24/23 Yes Carroll Sage, PA-C  blood glucose meter kit and supplies KIT Dispense based on patient and insurance preference. Use up to four times daily as directed. 09/04/22   Celine Mans, MD  Blood Glucose Monitoring Suppl (GLUCOCOM BLOOD GLUCOSE MONITOR) DEVI Use to check BS daily- E11.9 04/07/20   [provider]  Insulin Pen Needle (B-D UF III MINI PEN NEEDLES) 31G X 5 MM MISC USE TO INJECT VICTOZA AS DIRECTED 04/10/20   [provider]  Lancets (ACCU-CHEK MULTICLIX) lancets Check BS fasting in the AM and 2 hrs after evening meal 03/10/19   [provider]  naproxen (NAPROSYN) 500 MG tablet Take 1 tablet (500 mg total) by mouth 2  (two) times daily with a meal. 09/04/22   Celine Mans, MD  VICTOZA 18 MG/3ML SOPN ADMINISTER 1.8 MG UNDER THE SKIN DAILY 06/04/21   Autry-Lott, Randa Evens, DO      Allergies    Patient has no known allergies.    Review of Systems   Review of Systems  Constitutional:  Negative for chills and fever.  Respiratory:  Negative for shortness of breath.   Cardiovascular:  Negative for chest pain.  Gastrointestinal:  Negative for abdominal pain.  Musculoskeletal:  Positive for back pain.  Neurological:  Negative for headaches.    Physical Exam Updated Vital Signs BP 113/78 (BP Location: Left Arm)   Pulse 91   Temp 98.6 F (37 C)   Resp 18   Ht  (1.626 m)   Wt 81.6 kg   SpO2 99%   BMI 30.90 kg/m  Physical Exam Vitals and nursing note reviewed.  Constitutional:      General: She is not in acute distress.    Appearance: She is not ill-appearing.  HENT:     Head: Normocephalic and atraumatic.     Nose: No congestion.  Eyes:     Conjunctiva/sclera: Conjunctivae normal.  Cardiovascular:     Rate and Rhythm: Normal rate and regular rhythm.     Pulses: Normal pulses.     Heart sounds: No murmur heard.    No friction rub. No gallop.  Pulmonary:     Effort: No respiratory distress.  Breath sounds: No wheezing, rhonchi or rales.  Abdominal:     Palpations: Abdomen is soft.     Tenderness: There is no abdominal tenderness. There is no right CVA tenderness or left CVA tenderness.  Musculoskeletal:     Comments: Spine palpated, nontender to palpation, no step-off deformities noted, patient has point tenderness on her right buttocks, pain is focalized reproducible, patient has 2+ dorsal pedal pulses bilaterally, sensation intact to light touch, she has 2+ patellar reflexes, she does have slight decreased range of motion in that right leg secondary to pain.  Skin:    General: Skin is warm and dry.  Neurological:     Mental Status: She is alert.  Psychiatric:        Mood and  Affect: Mood normal.     ED Results / Procedures / Treatments   Labs (all labs ordered are listed, but only abnormal results are displayed) Labs Reviewed - No data to display  EKG None  Radiology No results found.  Procedures Procedures    Medications Ordered in ED Medications  ketorolac (TORADOL) 30 MG/ML injection 30 mg (30 mg Intramuscular Given 02/19/23 0307)    ED Course/ Medical Decision Making/ A&P                             Medical Decision Making Risk Prescription drug management.   This patient presents to the ED for concern of back pain, this involves an extensive number of treatment options, and is a complaint that carries with it a high risk of complications and morbidity.  The differential diagnosis includes AAA, dissection, pyelo-, kidney stone, spinal equina    Additional history obtained:  Additional history obtained from N/A External records from outside source obtained and reviewed including PCP notes   Co morbidities that complicate the patient evaluation  Diabetes  Social Determinants of Health:  N/A    Lab Tests:  I Ordered, and personally interpreted labs.  The pertinent results include: N/A   Imaging Studies ordered:  I ordered imaging studies including N/A I independently visualized and interpreted imaging which showed NA I agree with the radiologist interpretation   Cardiac Monitoring:  The patient was maintained on a cardiac monitor.  I personally viewed and interpreted the cardiac monitored which showed an underlying rhythm of: N/A   Medicines ordered and prescription drug management:  I ordered medication including Toradol I have reviewed the patients home medicines and have made adjustments as needed  Critical Interventions:  N/A   Reevaluation:  Presents with back pain, benign physical exam, agreement discharge at this time.  Consultations Obtained:  N/A    Test Considered:  CT scan of the  lumbar spine-defer to my specimen for fracture is very low at this time, she is not increased risk for pathological fractures, no trauma associated with this pain.    Rule out I have low suspicion for spinal fracture or spinal cord abnormality as patient denies urinary incontinency, retention, difficulty with bowel movements, denies saddle paresthesias.  Spine was palpated there is no step-off, crepitus or gross deformities felt, full range of motion, neurovascular fully intact in the lower extremities.  Doubt AAA or dissection presentation atypical, patient has low risk factors, pain is focalized reproducible.  I doubt UTI Pilo kidney stone no urinary symptoms no flank tenderness.. Low suspicion for septic arthritis as patient denies IV drug use, skin exam was performed no erythematous, edema or warm joints noted.  Dispostion and problem list  After consideration of the diagnostic results and the patients response to treatment, I feel that the patent would benefit from discharge.  Sciatica-likely acute on chronic, will start on a short course of steroids, prior with a muscle relaxer, referral to neurosurgery for further evaluation strict return precautions.            Final Clinical Impression(s) / ED Diagnoses Final diagnoses:  Sciatica of right side    Rx / DC Orders ED Discharge Orders          Ordered    predniSONE (DELTASONE) 10 MG tablet  Daily        02/19/23 0308    cyclobenzaprine (FLEXERIL) 10 MG tablet  2 times daily PRN        02/19/23 0308              Carroll Sage, PA-C 02/19/23 0309    Mesner, Barbara Cower, MD 02/19/23 [phone]

## 2023-02-19 NOTE — Discharge Instructions (Signed)
You have been seen here for back pain. I recommend taking over-the-counter pain medications like ibuprofen and/or Tylenol every 6 as needed.  Please follow dosage and on the back of bottle.  I also recommend applying heat to the area and stretching out the muscles as this will help decrease stiffness and pain.  I have given you information on exercises please follow.  I have given you steroids please take as prescribed, this medication can increase your blood sugar, we know that her blood sugar is getting higher than 300 please can discontinue this medication.  I have given a muscle relaxer this medication make you drowsy do not consume alcohol or operate heavy machinery while take this medication.  Please follow-up with neurosurgery for further evaluation.  Come back to the emergency department if you develop chest pain, shortness of breath, severe abdominal pain, uncontrolled nausea, vomiting, diarrhea.

## 2023-02-19 NOTE — ED Triage Notes (Signed)
Pt c/o right lower back pain that shoots into right leg since yesterday. Hx of sciatica and pt reports pain feels the same. Tried muscle relaxer's and gabapentin at home without relief. Endorses tingling in right leg and toes.

## 2023-03-06 DIAGNOSIS — Z6830 Body mass index (BMI) 30.0-30.9, adult: Secondary | ICD-10-CM | POA: Diagnosis not present

## 2023-03-06 DIAGNOSIS — M5441 Lumbago with sciatica, right side: Secondary | ICD-10-CM | POA: Diagnosis not present

## 2023-03-11 ENCOUNTER — Emergency Department (HOSPITAL_COMMUNITY): Payer: Medicaid Other

## 2023-03-11 ENCOUNTER — Emergency Department (HOSPITAL_COMMUNITY)
Admission: EM | Admit: 2023-03-11 | Discharge: 2023-03-12 | Disposition: A | Discharge status: Home / Self Care | Payer: Medicaid Other | Attending: Student | Admitting: Student

## 2023-03-11 ENCOUNTER — Other Ambulatory Visit: Payer: Self-pay | Admitting: Neurosurgery

## 2023-03-11 ENCOUNTER — Other Ambulatory Visit: Payer: Self-pay

## 2023-03-11 ENCOUNTER — Encounter (HOSPITAL_COMMUNITY): Payer: Self-pay | Admitting: Emergency Medicine

## 2023-03-11 DIAGNOSIS — Z794 Long term (current) use of insulin: Secondary | ICD-10-CM | POA: Diagnosis not present

## 2023-03-11 DIAGNOSIS — R0789 Other chest pain: Secondary | ICD-10-CM | POA: Diagnosis not present

## 2023-03-11 DIAGNOSIS — R0602 Shortness of breath: Secondary | ICD-10-CM | POA: Diagnosis not present

## 2023-03-11 DIAGNOSIS — M5441 Lumbago with sciatica, right side: Secondary | ICD-10-CM

## 2023-03-11 DIAGNOSIS — E119 Type 2 diabetes mellitus without complications: Secondary | ICD-10-CM | POA: Diagnosis not present

## 2023-03-11 DIAGNOSIS — R079 Chest pain, unspecified: Secondary | ICD-10-CM | POA: Diagnosis not present

## 2023-03-11 HISTORY — DX: Type 2 diabetes mellitus without complications: E11.9

## 2023-03-11 LAB — D-DIMER, QUANTITATIVE: D-Dimer, Quant: 0.53 ug/mL-FEU — ABNORMAL HIGH (ref 0.00–0.50)

## 2023-03-11 LAB — COMPREHENSIVE METABOLIC PANEL
ALT: 12 U/L (ref 0–44)
AST: 20 U/L (ref 15–41)
Albumin: 3.2 g/dL — ABNORMAL LOW (ref 3.5–5.0)
Alkaline Phosphatase: 79 U/L (ref 38–126)
Anion gap: 7 (ref 5–15)
BUN: 10 mg/dL (ref 6–20)
CO2: 26 mmol/L (ref 22–32)
Calcium: 8.8 mg/dL — ABNORMAL LOW (ref 8.9–10.3)
Chloride: 105 mmol/L (ref 98–111)
Creatinine, Ser: 1.03 mg/dL — ABNORMAL HIGH (ref 0.44–1.00)
GFR, Estimated: >60 mL/min (ref >60)
Glucose, Bld: 119 mg/dL — ABNORMAL HIGH (ref 70–99)
Potassium: 3.7 mmol/L (ref 3.5–5.1)
Sodium: 138 mmol/L (ref 135–145)
Total Bilirubin: 0.7 mg/dL (ref 0.3–1.2)
Total Protein: 7 g/dL (ref 6.5–8.1)

## 2023-03-11 LAB — CBC WITH DIFFERENTIAL/PLATELET
Abs Immature Granulocytes: 0.02 10*3/uL (ref 0.00–0.07)
Basophils Absolute: 0 10*3/uL (ref 0.0–0.1)
Basophils Relative: 1 %
Eosinophils Absolute: 0.4 10*3/uL (ref 0.0–0.5)
Eosinophils Relative: 7 %
HCT: 38.2 % (ref 36.0–46.0)
Hemoglobin: 11.8 g/dL — ABNORMAL LOW (ref 12.0–15.0)
Immature Granulocytes: 0 %
Lymphocytes Relative: 38 %
Lymphs Abs: 2.2 10*3/uL (ref 0.7–4.0)
MCH: 26.5 pg (ref 26.0–34.0)
MCHC: 30.9 g/dL (ref 30.0–36.0)
MCV: 85.7 fL (ref 80.0–100.0)
Monocytes Absolute: 0.5 10*3/uL (ref 0.1–1.0)
Monocytes Relative: 9 %
Neutro Abs: 2.7 10*3/uL (ref 1.7–7.7)
Neutrophils Relative %: 45 %
Platelets: 265 10*3/uL (ref 150–400)
RBC: 4.46 MIL/uL (ref 3.87–5.11)
RDW: 14.7 % (ref 11.5–15.5)
WBC: 5.9 10*3/uL (ref 4.0–10.5)
nRBC: 0 % (ref 0.0–0.2)

## 2023-03-11 LAB — TROPONIN I (HIGH SENSITIVITY): Troponin I (High Sensitivity): 4 ng/L (ref <18)

## 2023-03-11 NOTE — ED Triage Notes (Signed)
Patient reports pain on right rib cage with inspiration that started at 0900 today.  Patient denies cough or fevers.

## 2023-03-12 ENCOUNTER — Emergency Department (HOSPITAL_COMMUNITY): Payer: Medicaid Other

## 2023-03-12 ENCOUNTER — Other Ambulatory Visit: Payer: Self-pay | Admitting: Neurosurgery

## 2023-03-12 DIAGNOSIS — M5441 Lumbago with sciatica, right side: Secondary | ICD-10-CM

## 2023-03-12 DIAGNOSIS — R079 Chest pain, unspecified: Secondary | ICD-10-CM | POA: Diagnosis not present

## 2023-03-12 LAB — TROPONIN I (HIGH SENSITIVITY): Troponin I (High Sensitivity): 3 ng/L (ref <18)

## 2023-03-12 MED ORDER — KETOROLAC TROMETHAMINE 15 MG/ML IJ SOLN
15.0000 mg | Freq: Once | INTRAMUSCULAR | Status: AC
  Administered 2023-03-12: 15 mg via INTRAVENOUS
  Filled 2023-03-12: qty 1

## 2023-03-12 MED ORDER — ACETAMINOPHEN 500 MG PO TABS: 1000.0000 mg | ORAL_TABLET | Freq: Three times a day (TID) | ORAL | 0 refills | Status: AC

## 2023-03-12 MED ORDER — LIDOCAINE 5 % EX PTCH: 1.0000 | MEDICATED_PATCH | CUTANEOUS | 0 refills | Status: DC

## 2023-03-12 MED ORDER — LIDOCAINE 5 % EX PTCH
1.0000 | MEDICATED_PATCH | CUTANEOUS | Status: DC
  Administered 2023-03-12: 1 via TRANSDERMAL
  Filled 2023-03-12: qty 1

## 2023-03-12 MED ORDER — DICLOFENAC SODIUM 1 % EX GEL: 4.0000 g | Freq: Four times a day (QID) | CUTANEOUS | 0 refills | Status: AC

## 2023-03-12 MED ORDER — IOHEXOL 350 MG/ML SOLN
75.0000 mL | Freq: Once | INTRAVENOUS | Status: AC | PRN
  Administered 2023-03-12: 75 mL via INTRAVENOUS

## 2023-03-12 MED ORDER — NAPROXEN 375 MG PO TABS: 375.0000 mg | ORAL_TABLET | Freq: Two times a day (BID) | ORAL | 0 refills | Status: DC

## 2023-03-12 NOTE — ED Provider Notes (Signed)
[place] EMERGENCY DEPARTMENT AT [facility] Provider Note  CSN: [id number] Arrival date & time: 03/11/23 2120  Chief Complaint(s) Shortness of Breath  HPI Emily Glenn is a 47 y.o. female with PMH T2DM, Jehovah's Witness who presents emergency department for evaluation of shortness of breath and chest wall pain.  She states that she has had this intermittent right-sided posterior chest wall pain on and off for multiple months but is significantly worsened this morning at approximately 9 AM.  She endorses shortness of breath with deep inspiration due to pleuritic type chest pain but when at rest taking shallower breaths she does not have any chest pain.  Shortness of breath is similar stating that she feels short of breath because she cannot take a deep breath secondary to the pain.  Denies cough, fever, abdominal pain, nausea, vomiting, headache or other systemic symptoms.  No exertional component to the pain.  No known trauma to the chest wall or heavy lifting recently.   Past Medical History Past Medical History:  Diagnosis Date   Diabetes mellitus without complication (HCC)    Refusal of blood transfusions as patient is Jehovah's Witness    Patient Active Problem List   Diagnosis Date Noted   Greater trochanteric pain syndrome of left lower extremity 09/04/2022   Viral URI 04/18/2022   Macromastia 11/13/2021   Abdominal pain 11/13/2021   Encounter for screening colonoscopy 11/13/2021   Diabetes mellitus without complication (HCC) 03/06/2021   Right sciatic nerve pain 03/06/2021   Home Medication(s) Prior to Admission medications   Medication Sig Start Date End Date Taking? Authorizing Provider  blood glucose meter kit and supplies KIT Dispense based on patient and insurance preference. Use up to four times daily as directed. 09/04/22   Celine Mans, MD  Blood Glucose Monitoring Suppl (GLUCOCOM BLOOD GLUCOSE MONITOR) DEVI Use to check BS daily- E11.9 04/07/20    [provider]  cyclobenzaprine (FLEXERIL) 10 MG tablet Take 1 tablet (10 mg total) by mouth 2 (two) times daily as needed for muscle spasms. 02/19/23   Carroll Sage, PA-C  Insulin Pen Needle (B-D UF III MINI PEN NEEDLES) 31G X 5 MM MISC USE TO INJECT VICTOZA AS DIRECTED 04/10/20   [provider]  Lancets (ACCU-CHEK MULTICLIX) lancets Check BS fasting in the AM and 2 hrs after evening meal 03/10/19   [provider]  naproxen (NAPROSYN) 500 MG tablet Take 1 tablet (500 mg total) by mouth 2 (two) times daily with a meal. 09/04/22   Celine Mans, MD  VICTOZA 18 MG/3ML SOPN ADMINISTER 1.8 MG UNDER THE SKIN DAILY 06/04/21   Autry-Lott, Randa Evens, DO                                                                                                                                    Past Surgical History Past Surgical History:  Procedure Laterality Date   TUBAL LIGATION  14 years ago   Family History Family History  Problem Relation Age of Onset   Diabetes Maternal Grandmother    Cancer Maternal Grandmother        neck   High blood pressure Maternal Grandmother    Diabetes Maternal Grandfather    High blood pressure Maternal Grandfather    Cancer Maternal Grandfather    High blood pressure Paternal Grandmother    High blood pressure Paternal Grandfather     Social History Social History   Tobacco Use   Smoking status: Never   Smokeless tobacco: Never  Vaping Use   Vaping Use: Never used  Substance Use Topics   Alcohol use: Yes    Comment: wine occasionally   Drug use: No   Allergies Patient has no known allergies.  Review of Systems Review of Systems  Respiratory:  Positive for shortness of breath.   Cardiovascular:  Positive for chest pain.    Physical Exam Vital Signs  I have reviewed the triage vital signs BP 125/86 (BP Location: Right Arm)   Pulse 84   Temp 98.3 F (36.8 C) (Oral)   Resp 19   Ht 5\' 4"  (1.626 m)   Wt 81.2 kg   LMP  03/11/2023   SpO2 100%   BMI 30.73 kg/m   Physical Exam Vitals and nursing note reviewed.  Constitutional:      General: She is not in acute distress.    Appearance: She is well-developed.  HENT:     Head: Normocephalic and atraumatic.  Eyes:     Conjunctiva/sclera: Conjunctivae normal.  Cardiovascular:     Rate and Rhythm: Normal rate and regular rhythm.     Heart sounds: No murmur heard. Pulmonary:     Effort: Pulmonary effort is normal. No respiratory distress.  Musculoskeletal:        General: Tenderness present. No swelling.     Cervical back: Neck supple.  Skin:    General: Skin is warm and dry.     Capillary Refill: Capillary refill takes less than 2 seconds.  Neurological:     Mental Status: She is alert.  Psychiatric:        Mood and Affect: Mood normal.     ED Results and Treatments Labs (all labs ordered are listed, but only abnormal results are displayed) Labs Reviewed  CBC WITH DIFFERENTIAL/PLATELET - Abnormal; Notable for the following components:      Result Value   Hemoglobin 11.8 (*)    All other components within normal limits  COMPREHENSIVE METABOLIC PANEL - Abnormal; Notable for the following components:   Glucose, Bld 119 (*)    Creatinine, Ser 1.03 (*)    Calcium 8.8 (*)    Albumin 3.2 (*)    All other components within normal limits  D-DIMER, QUANTITATIVE - Abnormal; Notable for the following components:   D-Dimer, Quant 0.53 (*)    All other components within normal limits  TROPONIN I (HIGH SENSITIVITY)  TROPONIN I (HIGH SENSITIVITY)  Radiology DG Chest 2 View  Result Date: 03/11/2023 CLINICAL DATA:  Shortness of breath. EXAM: CHEST - 2 VIEW COMPARISON:  October 07, 2018 FINDINGS: The heart size and mediastinal contours are within normal limits. Both lungs are clear. The visualized skeletal structures are unremarkable.  IMPRESSION: No active cardiopulmonary disease. Electronically Signed   By: Aram Candela M.D.   On: 03/11/2023 22:15    Pertinent labs & imaging results that were available during my care of the patient were reviewed by me and considered in my medical decision making (see MDM for details).  Medications Ordered in ED Medications  ketorolac (TORADOL) 15 MG/ML injection 15 mg (has no administration in time range)  lidocaine (LIDODERM) 5 % 1 patch (has no administration in time range)                                                                                                                                     Procedures Procedures  (including critical care time)  Medical Decision Making / ED Course   This patient presents to the ED for concern of chest wall pain, this involves an extensive number of treatment options, and is a complaint that carries with it a high risk of complications and morbidity.  The differential diagnosis includes costochondritis, musculoskeletal strain, fracture, PE, ACS, pneumonia, PTX  MDM: Patient seen emergency room for evaluation of chest wall pain.  Physical exam with reproducible point tenderness along the rib line on the posterior lateral right chest wall at the superior aspect of the latissimus dorsi.  No appreciable wheezing on lung exam.  No lower extremity edema.  Laboratory evaluation largely unremarkable outside of anemia to 11.8 which is not far off from patient's baseline.  Chest x-ray unremarkable.  Patient does have a very minimally elevated D-dimer and thus a PE study was performed in the setting of her pleuritic chest pain which was also reassuringly normal.  She received Toradol and lidocaine patch and on reevaluation her symptoms have almost completely resolved.  Suspect musculoskeletal pain versus costochondritis today and she will be discharged with a course of Naprosyn and Lidoderm.  Heart score is less than 4 and I have low suspicion for  ACS.  At this time she does not meet inpatient criteria for admission she is safe for discharge with outpatient follow-up.   Additional history obtained: -Additional history obtained from daughter -External records from outside source obtained and reviewed including: Chart review including previous notes, labs, imaging, consultation notes   Lab Tests: -I ordered, reviewed, and interpreted labs.   The pertinent results include:   Labs Reviewed  CBC WITH DIFFERENTIAL/PLATELET - Abnormal; Notable for the following components:      Result Value   Hemoglobin 11.8 (*)    All other components within normal limits  COMPREHENSIVE METABOLIC PANEL - Abnormal; Notable for the following components:   Glucose, Bld 119 (*)    Creatinine, Ser  1.03 (*)    Calcium 8.8 (*)    Albumin 3.2 (*)    All other components within normal limits  D-DIMER, QUANTITATIVE - Abnormal; Notable for the following components:   D-Dimer, Quant 0.53 (*)    All other components within normal limits  TROPONIN I (HIGH SENSITIVITY)  TROPONIN I (HIGH SENSITIVITY)      EKG   EKG Interpretation  Date/Time:  Tuesday Mar 11 2023 21:28:54 EDT Ventricular Rate:  78 PR Interval:  124 QRS Duration: 70 QT Interval:  372 QTC Calculation: 424 R Axis:   31 Text Interpretation: Normal sinus rhythm Normal ECG When compared with ECG of 11-Mar-2023 21:28, PREVIOUS ECG IS PRESENT Confirmed by Rusti Arizmendi ([id number]) on 03/12/2023 1:13:30 AM         Imaging Studies ordered: I ordered imaging studies including chest x-ray, CT PE I independently visualized and interpreted imaging. I agree with the radiologist interpretation   Medicines ordered and prescription drug management: Meds ordered this encounter  Medications   ketorolac (TORADOL) 15 MG/ML injection 15 mg   lidocaine (LIDODERM) 5 % 1 patch    -I have reviewed the patients home medicines and have made adjustments as needed  Critical interventions none  Cardiac  Monitoring: The patient was maintained on a cardiac monitor.  I personally viewed and interpreted the cardiac monitored which showed an underlying rhythm of: NSR  Social Determinants of Health:  Factors impacting patients care include: Works at [employer] as a Engineer, maintenance: After the interventions noted above, I reevaluated the patient and found that they have :improved  Co morbidities that complicate the patient evaluation  Past Medical History:  Diagnosis Date   Diabetes mellitus without complication (HCC)    Refusal of blood transfusions as patient is Jehovah's Witness       Dispostion: I considered admission for this patient, but at this time she does not meet inpatient criteria for admission she is safe for discharge with outpatient follow-up     Final Clinical Impression(s) / ED Diagnoses Final diagnoses:  None     @PCDICTATION @    Phillp Dolores, Wyn Forster, MD 03/12/23 1652

## 2023-03-13 DIAGNOSIS — H5213 Myopia, bilateral: Secondary | ICD-10-CM | POA: Diagnosis not present

## 2023-03-21 ENCOUNTER — Ambulatory Visit: Payer: Medicaid Other | Admitting: Family Medicine

## 2023-03-25 ENCOUNTER — Ambulatory Visit: Payer: Medicaid Other | Admitting: Family Medicine

## 2023-03-25 ENCOUNTER — Telehealth: Payer: Self-pay | Admitting: Family Medicine

## 2023-03-25 NOTE — Telephone Encounter (Signed)
Patient no showed her appointment today at 4:10 pm. Notably, also no showed appointment with myself for identical complaint on 5/24.   Called patient, who reports she though the appointment was in June because of a message she got on MyChart. Chart review on my end indicated this was an imaging appointment with radiology.   Patient reported she still needed an appointment. Prefers morning appointments. Booked for next available, which is Thurs at 11:10 am with Dr. Anner Crete.   Will forward to no show pool for tracking.   Fayette Pho, MD

## 2023-03-27 ENCOUNTER — Ambulatory Visit: Payer: Self-pay | Admitting: Family Medicine

## 2023-03-30 ENCOUNTER — Other Ambulatory Visit: Payer: Medicaid Other

## 2023-04-03 ENCOUNTER — Other Ambulatory Visit: Payer: Self-pay | Admitting: Family Medicine

## 2023-04-03 DIAGNOSIS — Z1231 Encounter for screening mammogram for malignant neoplasm of breast: Secondary | ICD-10-CM

## 2023-04-08 ENCOUNTER — Ambulatory Visit: Payer: Medicaid Other

## 2023-04-10 ENCOUNTER — Ambulatory Visit
Admission: RE | Admit: 2023-04-10 | Discharge: 2023-04-10 | Disposition: A | Discharge status: Home / Self Care | Payer: Medicaid Other | Source: Ambulatory Visit | Attending: Family Medicine | Admitting: Family Medicine

## 2023-04-10 ENCOUNTER — Other Ambulatory Visit: Payer: Self-pay | Admitting: Plastic Surgery

## 2023-04-10 ENCOUNTER — Encounter: Payer: Self-pay | Admitting: Plastic Surgery

## 2023-04-10 ENCOUNTER — Other Ambulatory Visit: Payer: Self-pay | Admitting: Family Medicine

## 2023-04-10 DIAGNOSIS — Z1231 Encounter for screening mammogram for malignant neoplasm of breast: Secondary | ICD-10-CM

## 2023-04-11 ENCOUNTER — Ambulatory Visit (INDEPENDENT_AMBULATORY_CARE_PROVIDER_SITE_OTHER): Payer: Medicaid Other | Admitting: Family Medicine

## 2023-04-11 ENCOUNTER — Ambulatory Visit: Payer: Medicaid Other | Attending: Family Medicine

## 2023-04-11 VITALS — BP 108/70 | HR 77 | Ht 64.0 in | Wt 177.0 lb

## 2023-04-11 DIAGNOSIS — R002 Palpitations: Secondary | ICD-10-CM | POA: Diagnosis not present

## 2023-04-11 DIAGNOSIS — R7989 Other specified abnormal findings of blood chemistry: Secondary | ICD-10-CM

## 2023-04-11 DIAGNOSIS — M62838 Other muscle spasm: Secondary | ICD-10-CM | POA: Diagnosis present

## 2023-04-11 DIAGNOSIS — Z124 Encounter for screening for malignant neoplasm of cervix: Secondary | ICD-10-CM

## 2023-04-11 DIAGNOSIS — R7309 Other abnormal glucose: Secondary | ICD-10-CM

## 2023-04-11 DIAGNOSIS — Z1159 Encounter for screening for other viral diseases: Secondary | ICD-10-CM

## 2023-04-11 NOTE — Progress Notes (Unsigned)
Enrolled patient for a 7 day Zio XT monitor to be mailed to patients home   DOD to read 

## 2023-04-11 NOTE — Progress Notes (Unsigned)
SUBJECTIVE:   CHIEF COMPLAINT / HPI:   Emily Glenn is a 47 yo woman here with multiple complaints. See below.   Neck and shoulder pain Insidious onset Pain and tenderness over lateral neck near trapezius muscles bilaterally No aggravating or relieving factors No radiating pain, numbness or tingling of hands, or weakness  Labs from ED Patient was seen in ED 5/14 for chest wall pain She would like to go over results, as some of the yellow highlighting in MyChart makes her worried about some of the values CBC with mild anemia at 11.8 CMP with slight elevation of creatinine and slight reduction of albumin and calcium Discussed that albumin can make calcium look falsely low Reassured that creatinine was near normal Troponin negative x 2 D-dimer 0.53 (technically elevated) but subsequent CTA PE study negative for embolus  GYN concerns Patient requests GYN referral for Pap smear She would also like to discuss possible reversal of her tubal ligation Patient reports her husband has been pushing for another child recently and she would like to talk to her gynecologist about possible reversal She sounds undecided whether or not she would like another child  Mammogram results Completed mammogram yesterday, 6/13 Would like the results  Cardiac concerns Patient reports she is concerned about intermittent episodes of her "heart feeling funny" Unable to pinpoint duration, but this appears to be subacute or chronic Per ED note 5/14, months duration at that point She had the same feeling before she went to the ED 5/14 was diagnosed with chest wall pain Associated with shortness of breath and pleuritic type chest pain limiting deep inspiration Now happening every day  PERTINENT  PMH / PSH:  Patient Active Problem List   Diagnosis Date Noted   Trapezius muscle spasm 04/14/2023   Palpitations 04/14/2023   Cervical cancer screening 04/14/2023   Greater trochanteric pain syndrome of left  lower extremity 09/04/2022   Macromastia 11/13/2021   Abdominal pain 11/13/2021   Encounter for screening colonoscopy 11/13/2021   Diabetes mellitus without complication (HCC) 03/06/2021   Right sciatic nerve pain 03/06/2021     OBJECTIVE:   BP 108/70 (BP Location: Left Arm, Patient Position: Sitting, Cuff Size: Normal)   Pulse 77   Ht 5\' 4"  (1.626 m)   Wt 177 lb (80.3 kg)   LMP 03/11/2023   SpO2 98%   BMI 30.38 kg/m    PHQ-9:     04/17/2022   10:25 AM 03/06/2021    9:10 AM  Depression screen PHQ 2/9  Decreased Interest 0 0  Down, Depressed, Hopeless 0 0  PHQ - 2 Score 0 0  Altered sleeping 1 0  Tired, decreased energy 0 1  Change in appetite 0 0  Feeling bad or failure about yourself  0 0  Trouble concentrating 0 0  Moving slowly or fidgety/restless 0 0  Suicidal thoughts 0 0  PHQ-9 Score 1 1  Difficult doing work/chores Somewhat difficult     Physical Exam General: Awake, alert, oriented Cardiovascular: Regular rate and rhythm, S1 and S2 present, no murmurs auscultated Respiratory: Lung fields clear to auscultation bilaterally MSK: No midline spinal or paraspinal TTP, moderate TTP of bilateral superior trapezius associated with palpable muscle spasm Neuro: BUE strength 5/5 and equal bilaterally of shoulder girdle, push/pull, grip  ASSESSMENT/PLAN:   Need for hepatitis C screening test Patient amenable to drawing this lab with other venipuncture labs today.   Elevated hemoglobin A1c Due for A1c check. Patient amenable to drawing this lab with  other venipuncture labs today.   Elevated serum creatinine Previously only slightly elevated in ED. Discussed all ED labs from 5/14 with patient. Will recheck to ensure these have normalized.   Trapezius muscle spasm New. Subacute, intermittent. History and physical re-assuring against radicular or neurologic etiologies. Suspect spasm of bilateral trapezius. Discussed conservative management with stretches, massage, and  topical rubs such as IcyHot or other muscle rubs. Could use NSAID or tylenol. Discussed that we can do trigger point injections here and trigger point release if her pain does not respond to the initial conservative management.   Cervical cancer screening Will refer to GYN as requested for both PAP smear and discussion of possible reversal of her tubal ligation. Briefly discussed her age, previous tubal ligation, and implications thereof on potential for pregnancy. Will place referral.   Palpitations Subacute, intermittent. Going on for months. ED workup 5/14 reassuring against N/STEMI and PE. Physical exam re-assuring. Will order Zio patch for 7 days to look for arrhythmia.      Emily Pho, MD [facility]

## 2023-04-11 NOTE — Patient Instructions (Addendum)
It was wonderful to see you today. Thank you for allowing me to be a part of your care. Below is a short summary of what we discussed at your visit today:  Blood work We talked through the results from the ED.   We will repeat labs today. If the results are normal, I will send you a letter or MyChart message. If the results are abnormal, I will give you a call.   Neck pain Looks and sounds like muscle pain.  Try stretches, massage, and rub with muscle rub or icy hot.  If it gets worse we can consider trigger point injections and/or x-rays, depending on the symptoms.    PAP smear I have referred you to gynecology for tubal ligation reversal discussion.  Someone from their office should be calling you in 1 to 2 weeks to schedule an appointment.  If you do not hear from them, let us know. We may need to nudge along the referral.    Mammogram  They will mail you a letter with results.   Palpitations The cardiology office will mail you the monitor. Wear for 7 days as instructed. Once the results are back we will let you know.   Please follow up with your PCP for more discussion of this.    Please bring all of your medications to every appointment! If you have any questions or concerns, please do not hesitate to contact us via phone or MyChart message.   Fayette Pho, MD

## 2023-04-12 LAB — HCV AB W REFLEX TO QUANT PCR: HCV Ab: NONREACTIVE

## 2023-04-12 LAB — BASIC METABOLIC PANEL
BUN/Creatinine Ratio: 13 (ref 9–23)
BUN: 12 mg/dL (ref 6–24)
CO2: 23 mmol/L (ref 20–29)
Calcium: 9.2 mg/dL (ref 8.7–10.2)
Chloride: 104 mmol/L (ref 96–106)
Creatinine, Ser: 0.95 mg/dL (ref 0.57–1.00)
Glucose: 92 mg/dL (ref 70–99)
Potassium: 4.3 mmol/L (ref 3.5–5.2)
Sodium: 139 mmol/L (ref 134–144)
eGFR: 74 mL/min/{1.73_m2} (ref >59)

## 2023-04-12 LAB — HEMOGLOBIN A1C
Est. average glucose Bld gHb Est-mCnc: 131 mg/dL
Hgb A1c MFr Bld: 6.2 % — ABNORMAL HIGH (ref 4.8–5.6)

## 2023-04-12 LAB — HCV INTERPRETATION

## 2023-04-14 ENCOUNTER — Encounter: Payer: Self-pay | Admitting: Family Medicine

## 2023-04-14 DIAGNOSIS — Z124 Encounter for screening for malignant neoplasm of cervix: Secondary | ICD-10-CM | POA: Insufficient documentation

## 2023-04-14 DIAGNOSIS — R002 Palpitations: Secondary | ICD-10-CM

## 2023-04-14 DIAGNOSIS — R7309 Other abnormal glucose: Secondary | ICD-10-CM | POA: Insufficient documentation

## 2023-04-14 DIAGNOSIS — Z1159 Encounter for screening for other viral diseases: Secondary | ICD-10-CM | POA: Insufficient documentation

## 2023-04-14 DIAGNOSIS — R7989 Other specified abnormal findings of blood chemistry: Secondary | ICD-10-CM

## 2023-04-14 DIAGNOSIS — M62838 Other muscle spasm: Secondary | ICD-10-CM | POA: Insufficient documentation

## 2023-04-14 HISTORY — DX: Other specified abnormal findings of blood chemistry: R79.89

## 2023-04-14 NOTE — Assessment & Plan Note (Signed)
Subacute, intermittent. Going on for months. ED workup 5/14 reassuring against N/STEMI and PE. Physical exam re-assuring. Will order Zio patch for 7 days to look for arrhythmia.

## 2023-04-14 NOTE — Assessment & Plan Note (Signed)
Will refer to GYN as requested for both PAP smear and discussion of possible reversal of her tubal ligation. Briefly discussed her age, previous tubal ligation, and implications thereof on potential for pregnancy. Will place referral.

## 2023-04-14 NOTE — Assessment & Plan Note (Signed)
New. Subacute, intermittent. History and physical re-assuring against radicular or neurologic etiologies. Suspect spasm of bilateral trapezius. Discussed conservative management with stretches, massage, and topical rubs such as IcyHot or other muscle rubs. Could use NSAID or tylenol. Discussed that we can do trigger point injections here and trigger point release if her pain does not respond to the initial conservative management.

## 2023-04-14 NOTE — Assessment & Plan Note (Signed)
Patient amenable to drawing this lab with other venipuncture labs today.

## 2023-04-14 NOTE — Assessment & Plan Note (Signed)
Due for A1c check. Patient amenable to drawing this lab with other venipuncture labs today.

## 2023-04-14 NOTE — Assessment & Plan Note (Signed)
Previously only slightly elevated in ED. Discussed all ED labs from 5/14 with patient. Will recheck to ensure these have normalized.

## 2023-04-16 ENCOUNTER — Telehealth: Payer: Self-pay | Admitting: Family Medicine

## 2023-04-16 NOTE — Telephone Encounter (Signed)
Error.  ?Emily Witty, MD ? ?

## 2023-04-20 ENCOUNTER — Ambulatory Visit
Admission: RE | Admit: 2023-04-20 | Discharge: 2023-04-20 | Disposition: A | Discharge status: Home / Self Care | Payer: Medicaid Other | Source: Ambulatory Visit | Attending: Neurosurgery | Admitting: Neurosurgery

## 2023-04-20 DIAGNOSIS — M5441 Lumbago with sciatica, right side: Secondary | ICD-10-CM

## 2023-05-04 ENCOUNTER — Encounter (HOSPITAL_COMMUNITY): Payer: Self-pay

## 2023-05-04 ENCOUNTER — Other Ambulatory Visit: Payer: Self-pay

## 2023-05-04 ENCOUNTER — Emergency Department (HOSPITAL_COMMUNITY): Payer: Medicaid Other

## 2023-05-04 ENCOUNTER — Emergency Department (HOSPITAL_COMMUNITY)
Admission: EM | Admit: 2023-05-04 | Discharge: 2023-05-05 | Disposition: A | Discharge status: Home / Self Care | Payer: Medicaid Other | Attending: Emergency Medicine | Admitting: Emergency Medicine

## 2023-05-04 DIAGNOSIS — I517 Cardiomegaly: Secondary | ICD-10-CM | POA: Diagnosis not present

## 2023-05-04 DIAGNOSIS — R0989 Other specified symptoms and signs involving the circulatory and respiratory systems: Secondary | ICD-10-CM | POA: Diagnosis not present

## 2023-05-04 DIAGNOSIS — M5416 Radiculopathy, lumbar region: Secondary | ICD-10-CM | POA: Insufficient documentation

## 2023-05-04 DIAGNOSIS — R0789 Other chest pain: Secondary | ICD-10-CM | POA: Diagnosis not present

## 2023-05-04 DIAGNOSIS — M545 Low back pain, unspecified: Secondary | ICD-10-CM | POA: Diagnosis present

## 2023-05-04 DIAGNOSIS — R079 Chest pain, unspecified: Secondary | ICD-10-CM | POA: Diagnosis not present

## 2023-05-04 DIAGNOSIS — Z794 Long term (current) use of insulin: Secondary | ICD-10-CM | POA: Insufficient documentation

## 2023-05-04 NOTE — ED Triage Notes (Addendum)
Complaining of chest pain in the center of her chest that started 2 days ago. Having some shortness of breath with it and a slight cough. She also has pain in the lower back that she is seeing a Retail buyer for but she has not had her MRI yet. Would like to have something for that pain as well.

## 2023-05-05 LAB — CBC
HCT: 36.3 % (ref 36.0–46.0)
Hemoglobin: 11.6 g/dL — ABNORMAL LOW (ref 12.0–15.0)
MCH: 27.3 pg (ref 26.0–34.0)
MCHC: 32 g/dL (ref 30.0–36.0)
MCV: 85.4 fL (ref 80.0–100.0)
Platelets: 243 10*3/uL (ref 150–400)
RBC: 4.25 MIL/uL (ref 3.87–5.11)
RDW: 15 % (ref 11.5–15.5)
WBC: 8.1 10*3/uL (ref 4.0–10.5)
nRBC: 0 % (ref 0.0–0.2)

## 2023-05-05 LAB — BASIC METABOLIC PANEL
Anion gap: 6 (ref 5–15)
BUN: 11 mg/dL (ref 6–20)
CO2: 26 mmol/L (ref 22–32)
Calcium: 9.1 mg/dL (ref 8.9–10.3)
Chloride: 105 mmol/L (ref 98–111)
Creatinine, Ser: 1.06 mg/dL — ABNORMAL HIGH (ref 0.44–1.00)
GFR, Estimated: >60 mL/min (ref >60)
Glucose, Bld: 145 mg/dL — ABNORMAL HIGH (ref 70–99)
Potassium: 4 mmol/L (ref 3.5–5.1)
Sodium: 137 mmol/L (ref 135–145)

## 2023-05-05 LAB — TROPONIN I (HIGH SENSITIVITY)
Troponin I (High Sensitivity): 3 ng/L (ref <18)
Troponin I (High Sensitivity): 4 ng/L (ref <18)

## 2023-05-05 LAB — HCG, SERUM, QUALITATIVE: Preg, Serum: NEGATIVE

## 2023-05-05 LAB — BRAIN NATRIURETIC PEPTIDE: B Natriuretic Peptide: 16 pg/mL (ref 0.0–100.0)

## 2023-05-05 MED ORDER — METHOCARBAMOL 500 MG PO TABS: 500.0000 mg | ORAL_TABLET | Freq: Two times a day (BID) | ORAL | 0 refills | Status: DC

## 2023-05-05 MED ORDER — KETOROLAC TROMETHAMINE 15 MG/ML IJ SOLN
15.0000 mg | Freq: Once | INTRAMUSCULAR | Status: AC
  Administered 2023-05-05: 15 mg via INTRAMUSCULAR
  Filled 2023-05-05: qty 1

## 2023-05-05 MED ORDER — METHOCARBAMOL 1000 MG/10ML IJ SOLN
1000.0000 mg | Freq: Once | INTRAMUSCULAR | Status: DC
  Filled 2023-05-05: qty 10

## 2023-05-05 MED ORDER — METHOCARBAMOL 500 MG PO TABS
750.0000 mg | ORAL_TABLET | Freq: Once | ORAL | Status: AC
  Administered 2023-05-05: 750 mg via ORAL
  Filled 2023-05-05: qty 2

## 2023-05-05 NOTE — ED Provider Notes (Signed)
[place] [facility] AT [facility] Provider Note   CSN: [id number] Arrival date & time: 05/04/23  2232     History  Chief Complaint  Patient presents with   Chest Pain    Emily Glenn is a 47 y.o. female.  47 year old female with history of prediabetes (previously on meds, PCP dc meds and pt reports A1c went up and was not started back on medications), chronic low back pain, palpitations, presents with complaint of low back pain and chest pain. Patient states she has had pain across her lower back for the past 4 years. Previously has radiated down the right leg, 3 days ago had severe pain shoot down the left leg while standing at work, also with sudden onset of severe mid sternal chest tightness. Pain has been constant since that time. Pain is worse with walking. Patient has been seen for her low back pain by neurosurgery who ordered an MRI but patient was unable to obtain open MRI and is waiting for the order for her to be sedated for her MRI. Also seen by PCP for palpitations and ordered Xio patch however patient could not get the patch to stay stuck to her skin for more than 24 hours. States she has done PT years ago that never helped her back. No loss of bowel or bladder control, no abdominal pain, no saddle paresthesias.        Home Medications Prior to Admission medications   Medication Sig Start Date End Date Taking? Authorizing Provider  methocarbamol (ROBAXIN) 500 MG tablet Take 1 tablet (500 mg total) by mouth 2 (two) times daily. 05/05/23  Yes Jeannie Fend, PA-C  blood glucose meter kit and supplies KIT Dispense based on patient and insurance preference. Use up to four times daily as directed. Patient not taking: Reported on 04/11/2023 09/04/22   Celine Mans, MD  Blood Glucose Monitoring Suppl (GLUCOCOM BLOOD GLUCOSE MONITOR) DEVI Use to check BS daily- E11.9 Patient not taking: Reported on 04/11/2023 04/07/20   [provider]  Insulin Pen  Needle (B-D UF III MINI PEN NEEDLES) 31G X 5 MM MISC USE TO INJECT VICTOZA AS DIRECTED Patient not taking: Reported on 04/11/2023 04/10/20   [provider]  Lancets (ACCU-CHEK MULTICLIX) lancets Check BS fasting in the AM and 2 hrs after evening meal Patient not taking: Reported on 04/11/2023 03/10/19   [provider]  lidocaine (LIDODERM) 5 % Place 1 patch onto the skin daily. Remove & Discard patch within 12 hours or as directed by MD Patient not taking: Reported on 04/11/2023 03/12/23   Kommor, Wyn Forster, MD  VICTOZA 18 MG/3ML SOPN ADMINISTER 1.8 MG UNDER THE SKIN DAILY Patient not taking: Reported on 04/11/2023 06/04/21   Autry-Lott, Randa Evens, DO      Allergies    Patient has no known allergies.    Review of Systems   Review of Systems Negative except as per HPI Physical Exam Updated Vital Signs BP 120/72 (BP Location: Right Arm)   Pulse 76   Temp 98.8 F (37.1 C) (Oral)   Resp 18   Ht 5\' 4"  (1.626 m)   Wt 80.3 kg   LMP 05/04/2023   SpO2 100%   BMI 30.38 kg/m  Physical Exam Vitals and nursing note reviewed.  Constitutional:      General: She is not in acute distress.    Appearance: She is well-developed. She is not diaphoretic.  HENT:     Head: Normocephalic and atraumatic.  Cardiovascular:     Rate and Rhythm: Normal rate and regular rhythm.     Heart sounds: Normal heart sounds. No murmur heard. Pulmonary:     Effort: Pulmonary effort is normal.     Breath sounds: Normal breath sounds.  Chest:     Chest wall: Tenderness present.       Comments: Severe pain with light palpation of chest which patient reports reproduces her pain.  Abdominal:     Palpations: Abdomen is soft.     Tenderness: There is no abdominal tenderness.  Musculoskeletal:     Cervical back: Normal range of motion and neck supple.     Lumbar back: Tenderness present. No bony tenderness.       Back:     Right lower leg: Tenderness present. No edema.     Left lower leg: Tenderness  present. No edema.     Comments: Unable to straight leg raise left leg at all secondary to severe pain in leg and back. Left leg plantar and dorsiflexion weak compared to right. Diminished effort to raise left leg. Reflexes intact in lower extremities.  Using spouses rolling walker to ambulate, toe touching on left to walk.   Skin:    General: Skin is warm and dry.  Neurological:     Mental Status: She is alert and oriented to person, place, and time.  Psychiatric:        Behavior: Behavior normal.     ED Results / Procedures / Treatments   Labs (all labs ordered are listed, but only abnormal results are displayed) Labs Reviewed  BASIC METABOLIC PANEL - Abnormal; Notable for the following components:      Result Value   Glucose, Bld 145 (*)    Creatinine, Ser 1.06 (*)    All other components within normal limits  CBC - Abnormal; Notable for the following components:   Hemoglobin 11.6 (*)    All other components within normal limits  HCG, SERUM, QUALITATIVE  BRAIN NATRIURETIC PEPTIDE  TROPONIN I (HIGH SENSITIVITY)  TROPONIN I (HIGH SENSITIVITY)    EKG None  Radiology DG Chest 2 View  Result Date: 05/04/2023 CLINICAL DATA:  Chest pain. EXAM: CHEST - 2 VIEW COMPARISON:  Radiograph and CT 03/11/2023 FINDINGS: Lower lung volumes from prior exam. Heart size upper normal. Trace fluid in the fissures. No focal airspace disease. No pulmonary edema or pneumothorax. No acute osseous abnormalities are seen. IMPRESSION: Borderline cardiomegaly with trace fluid in the fissures. Electronically Signed   By: Narda Rutherford M.D.   On: 05/04/2023 23:20    Procedures Procedures    Medications Ordered in ED Medications  ketorolac (TORADOL) 15 MG/ML injection 15 mg (15 mg Intramuscular Given 05/05/23 0414)  methocarbamol (ROBAXIN) tablet 750 mg (750 mg Oral Given 05/05/23 0403)    ED Course/ Medical Decision Making/ A&P                             Medical Decision Making Amount and/or  Complexity of Data Reviewed Labs: ordered. Radiology: ordered.   47 year old female with complaint of pain in her chest and low back as above. Pain is acute on chronic in nature. Chest wall pain is clearly reproduced with palpation.  Skin is normal in appearance, doubt shingles.  Labs are reassuring including troponins negative, BNP within normal notes.  hCG negative.  CBC and BMP without significant findings.  Chest x-ray with decreased inspiratory effort compared to  prior on file, read by cardiology today as borderline cardiomegaly with trace fluid in the fissures, question if this is secondary to poor inspiratory effort. Prior long-term monitor which was worn for 1 day and 5 hours reviewed from 05/02/2023. EKG today with normal sinus rhythm with a rate of 93. In regards to her back pain, acute on chronic back pain, ambulatory with a rollator today which is her husband's rollator.  She is toe touching with the left leg.  She has notable pain to the left and right lower back without midline or bony tenderness.  No red flag symptoms.  Reflexes are intact, leg weak on left compared to right with question of effort.  Recommend patient follow-up with her PCP, consider potential inflammatory rheumatologic cause for her pain.  Feel she would really benefit from physical therapy in addition to following up with her neurosurgeon.  Patient is provided with Robaxin and Toradol in the ER.  Discharged with prescription for Robaxin.        Final Clinical Impression(s) / ED Diagnoses Final diagnoses:  Chest wall pain  Lumbar radiculopathy    Rx / DC Orders ED Discharge Orders          Ordered    methocarbamol (ROBAXIN) 500 MG tablet  2 times daily        05/05/23 0326              Jeannie Fend, PA-C 05/05/23 0419    Mesner, Barbara Cower, MD 05/05/23 [phone]

## 2023-05-05 NOTE — Discharge Instructions (Signed)
Follow up with your primary care provider. Take Robaxin as prescribed. Do not drive if taking Robaxin.

## 2023-05-05 NOTE — ED Notes (Signed)
Patient here c/o midsternal chest pressure that began 2 days ago and has been intermittent x last 4 years. Patient also requesting pain meds for pain to lower lumbar region that she states she is seeing neuro surgeon for and pending an MRI for. Patient ambulatory to rr and back to room with use of rolling walker. Patient tells provider cp has resolved and she currently has none. Patient a/o x 4 respirations even and non labored

## 2023-05-07 ENCOUNTER — Ambulatory Visit
Admission: RE | Admit: 2023-05-07 | Discharge: 2023-05-07 | Disposition: A | Discharge status: Home / Self Care | Payer: Medicaid Other | Source: Ambulatory Visit | Attending: Neurosurgery | Admitting: Neurosurgery

## 2023-05-07 DIAGNOSIS — M48061 Spinal stenosis, lumbar region without neurogenic claudication: Secondary | ICD-10-CM | POA: Diagnosis not present

## 2023-05-19 DIAGNOSIS — M5441 Lumbago with sciatica, right side: Secondary | ICD-10-CM | POA: Diagnosis not present

## 2023-05-19 DIAGNOSIS — Z6830 Body mass index (BMI) 30.0-30.9, adult: Secondary | ICD-10-CM | POA: Diagnosis not present

## 2023-05-27 ENCOUNTER — Encounter: Payer: Self-pay | Admitting: Family Medicine

## 2023-05-27 ENCOUNTER — Other Ambulatory Visit: Payer: Self-pay

## 2023-05-27 ENCOUNTER — Ambulatory Visit (INDEPENDENT_AMBULATORY_CARE_PROVIDER_SITE_OTHER): Payer: Medicaid Other | Admitting: Family Medicine

## 2023-05-27 VITALS — BP 106/80 | HR 83 | Ht 64.0 in | Wt 177.8 lb

## 2023-05-27 DIAGNOSIS — K625 Hemorrhage of anus and rectum: Secondary | ICD-10-CM | POA: Diagnosis not present

## 2023-05-27 DIAGNOSIS — G8929 Other chronic pain: Secondary | ICD-10-CM

## 2023-05-27 DIAGNOSIS — M545 Low back pain, unspecified: Secondary | ICD-10-CM | POA: Diagnosis not present

## 2023-05-27 DIAGNOSIS — K921 Melena: Secondary | ICD-10-CM | POA: Diagnosis not present

## 2023-05-27 NOTE — Progress Notes (Signed)
  Date of Visit: 05/27/2023   SUBJECTIVE:   HPI:  Emily Glenn presents today for a same day appointment to discuss her MRI results, lab results, and blood in stool.  MRI results: Recently had lumbar spine done, ordered by neurosurgeon Dr. Franky Macho.  She followed up with him in the office and reports not understanding what the results meant.  Wants to review them in detail.  Was advised that surgery is not necessary.  Is in a lot of pain, reports ongoing pain for several years.  Has tried many medications without relief.  Has never seen pain management.  Lab results: Also wants to review results from labs she had done here in June.  Blood in stool.  First noticed this about 3 weeks ago.  Had blood in stool, then stopped, then returned.  Blood is bright red.  No history of hemorrhoids.  Blood is mixed into the stool.  Endorses occasional vague abdominal discomfort.  Denies fevers or weight loss.  Has never had a colonoscopy.  OBJECTIVE:   BP 106/80   Pulse 83   Ht 5\' 4"  (1.626 m)   Wt 177 lb 12.8 oz (80.6 kg)   LMP 05/04/2023   SpO2 100%   BMI 30.52 kg/m  Gen: No acute distress, pleasant, cooperative HEENT: Normocephalic, atraumatic Heart: Regular rate and rhythm, no murmur Lungs: Clear to auscultation bilaterally, normal effort Abdomen: Soft, nontender to palpation, no masses organomegaly Neuro: Alert, grossly nonfocal, speech normal Back: Paraspinal musculature tender to palpation in lumbar area  ASSESSMENT/PLAN:   Health maintenance:  -Due for colonoscopy for screening, referral placed given rectal bleeding  Chronic back pain Reviewed MRI findings in detail with patient, interpreting for her what the report means.  She is dissatisfied with her lack of pain control and lack of explanation for her pain.  Offered referral to pain management, which she would like.  Referral placed.  Rectal bleeding New.  Past due for colonoscopy per new screening guidelines.  Refer to GI for  colonoscopy.  Check CBC today.  Offered rectal exam to assess for presence of hemorrhoids, but pain elected to defer this to GI.   Emily J. Pollie Meyer, MD [facility]

## 2023-05-27 NOTE — Patient Instructions (Signed)
It was nice to meet you today!  Checking blood counts today Referring to pain management Referring to GI doctor for the blood in your stool.  I suspect you will need a colonoscopy.  Be well, Dr. Pollie Meyer

## 2023-05-28 DIAGNOSIS — K625 Hemorrhage of anus and rectum: Secondary | ICD-10-CM | POA: Insufficient documentation

## 2023-05-28 NOTE — Assessment & Plan Note (Signed)
New.  Past due for colonoscopy per new screening guidelines.  Refer to GI for colonoscopy.  Check CBC today.  Offered rectal exam to assess for presence of hemorrhoids, but pain elected to defer this to GI.

## 2023-06-16 ENCOUNTER — Telehealth: Payer: Self-pay

## 2023-06-16 NOTE — Telephone Encounter (Signed)
Patient LVM on nurse line regarding updated letter needed for work.   She was also asking about results.   Attempted to call patient back to gather more information as to what she is needing.   She did not answer, no ability to LVM.   Will try to reach patient at later time.   Veronda Prude, RN

## 2023-06-17 NOTE — Telephone Encounter (Signed)
Patient stated "it was due yesterday", she would like a call when it is completed.   Call back number is [phone].  Thanks!

## 2023-06-17 NOTE — Telephone Encounter (Signed)
Patient called back checking on letter.   She stated she is needing the same letter but needs to add the (END DATE) which she stated would be 06/29/2023. She needs it reprinted.   Please Advise.   Thanks!

## 2023-06-19 NOTE — Telephone Encounter (Signed)
Patient walked in yesterday and Page Lorin Picket took care of this Emily Dodrill, MD

## 2023-07-16 ENCOUNTER — Encounter: Payer: Self-pay | Admitting: Internal Medicine

## 2023-08-04 ENCOUNTER — Ambulatory Visit: Payer: Medicaid Other | Admitting: Internal Medicine

## 2023-08-27 ENCOUNTER — Telehealth: Payer: Self-pay | Admitting: Nurse Practitioner

## 2023-08-27 ENCOUNTER — Encounter: Payer: Self-pay | Admitting: Nurse Practitioner

## 2023-08-27 ENCOUNTER — Ambulatory Visit: Payer: Medicaid Other | Admitting: Nurse Practitioner

## 2023-08-27 VITALS — BP 118/80 | HR 89 | Temp 97.0°F | Ht 64.0 in | Wt 180.4 lb

## 2023-08-27 DIAGNOSIS — G43709 Chronic migraine without aura, not intractable, without status migrainosus: Secondary | ICD-10-CM

## 2023-08-27 DIAGNOSIS — Z1211 Encounter for screening for malignant neoplasm of colon: Secondary | ICD-10-CM | POA: Diagnosis not present

## 2023-08-27 DIAGNOSIS — G8929 Other chronic pain: Secondary | ICD-10-CM

## 2023-08-27 DIAGNOSIS — E119 Type 2 diabetes mellitus without complications: Secondary | ICD-10-CM

## 2023-08-27 DIAGNOSIS — Z9851 Tubal ligation status: Secondary | ICD-10-CM | POA: Diagnosis not present

## 2023-08-27 DIAGNOSIS — Z7985 Long-term (current) use of injectable non-insulin antidiabetic drugs: Secondary | ICD-10-CM

## 2023-08-27 DIAGNOSIS — E559 Vitamin D deficiency, unspecified: Secondary | ICD-10-CM

## 2023-08-27 DIAGNOSIS — M5440 Lumbago with sciatica, unspecified side: Secondary | ICD-10-CM

## 2023-08-27 MED ORDER — FREESTYLE LIBRE 3 SENSOR MISC: 1.0000 | 1 refills | Status: DC

## 2023-08-27 MED ORDER — OZEMPIC (0.25 OR 0.5 MG/DOSE) 2 MG/1.5ML ~~LOC~~ SOPN: 0.2500 mg | PEN_INJECTOR | SUBCUTANEOUS | 0 refills | Status: DC

## 2023-08-27 MED ORDER — PROPRANOLOL HCL ER 60 MG PO CP24: 60.0000 mg | ORAL_CAPSULE | Freq: Every day | ORAL | 1 refills | Status: DC

## 2023-08-27 MED ORDER — FREESTYLE LIBRE 2 SENSOR MISC: 1.0000 | 1 refills | Status: DC

## 2023-08-27 NOTE — Telephone Encounter (Signed)
Free style libra plus is the new one they use please resend this it . The pharmacy no longer use Continuous Glucose Sensor (FREESTYLE LIBRE 3 SENSOR) MISC [id number] . The patient said call her for additional information

## 2023-08-27 NOTE — Progress Notes (Signed)
New Patient Visit  BP 118/80 (BP Location: Left Arm)   Pulse 89   Temp (!) 97 F (36.1 C)   Ht 5\' 4"  (1.626 m)   Wt 180 lb 6.4 oz (81.8 kg)   SpO2 99%   BMI 30.97 kg/m    Subjective:    Patient ID: Emily Glenn, female    DOB: 1976-06-04, 47 y.o.   MRN: [id number]  CC: Chief Complaint  Patient presents with   Establish Care    NP. Est. Care, referral to an OBGYN, Prediabetes Issues, Fatigue    HPI: Emily Glenn is a 47 y.o. female presents for new patient visit to establish care.  Introduced to Publishing rights manager role and practice setting.  All questions answered.  Discussed provider/patient relationship and expectations.  Discussed the use of AI scribe software for clinical note transcription with the patient, who gave verbal consent to proceed.  History of Present Illness   Emily Glenn, a married individual with a history of diabetes, migraines, and lower back pain, presents with concerns about increasing blood sugar levels, frequent migraines, and intermittent lower back pain. She reports that she was taken off Victoza, a medication for diabetes, after her A1c level was 5.1. However, her blood sugar levels have been increasing since then, with the last recorded A1c level at 6.7. She expresses frustration about being taken off Victoza, as she believes it was helping to manage her blood sugar levels effectively. It was also helping her lose weight.  In addition to her concerns about diabetes, [name] also reports frequent migraines, occurring about three times a week. She was previously on Topamax for migraine prevention but was taken off this medication as well. She describes the migraines as severe, often requiring her to take multiple doses of Tylenol for relief. She expresses a desire for a preventative treatment to avoid the onset of migraines.  [name] also discusses intermittent lower back pain, which she believes is due to her sciatica nerve. She reports that the pain is severe enough  to affect her mobility and has not found relief with physical therapy or medications such as gabapentin. She expresses frustration with the lack of effective treatment for her back pain when she was following with neurosurgery.   Emily Glenn also mentions a desire to explore options for a tubal reversal, as she is considering having another child. She would like a referral to GYN.     Depression and Anxiety Screen done:     08/27/2023    3:08 PM 05/27/2023    8:34 AM 04/17/2022   10:25 AM 03/06/2021    9:10 AM  Depression screen PHQ 2/9  Decreased Interest 0 0 0 0  Down, Depressed, Hopeless 0 0 0 0  PHQ - 2 Score 0 0 0 0  Altered sleeping 2 2 1  0  Tired, decreased energy 2 2 0 1  Change in appetite 0 0 0 0  Feeling bad or failure about yourself  0 0 0 0  Trouble concentrating 0 0 0 0  Moving slowly or fidgety/restless 0 0 0 0  Suicidal thoughts 0 0 0 0  PHQ-9 Score 4 4 1 1   Difficult doing work/chores Somewhat difficult  Somewhat difficult       08/27/2023    3:08 PM  GAD 7 : Generalized Anxiety Score  Nervous, Anxious, on Edge 0  Control/stop worrying 0  Worry too much - different things 0  Trouble relaxing 2  Restless 0  Easily annoyed or irritable 1  Afraid - awful might happen 0  Total GAD 7 Score 3  Anxiety Difficulty Somewhat difficult    Past Medical History:  Diagnosis Date   Diabetes mellitus without complication (HCC)    Elevated serum creatinine 04/14/2023   Migraine    Refusal of blood transfusions as patient is Jehovah's Witness    Viral URI 04/18/2022    Past Surgical History:  Procedure Laterality Date   TUBAL LIGATION     14 years ago    Family History  Problem Relation Age of Onset   Diabetes Maternal Grandmother    Cancer Maternal Grandmother        neck   High blood pressure Maternal Grandmother    Diabetes Maternal Grandfather    High blood pressure Maternal Grandfather    Cancer Maternal Grandfather    High blood pressure Paternal  Grandmother    High blood pressure Paternal Grandfather      Social History   Tobacco Use   Smoking status: Never   Smokeless tobacco: Never  Vaping Use   Vaping status: Never Used  Substance Use Topics   Alcohol use: Yes    Comment: wine occasionally   Drug use: No    No current outpatient medications on file prior to visit.   No current facility-administered medications on file prior to visit.     Review of Systems  Constitutional: Negative.   HENT: Negative.    Eyes: Negative.   Respiratory: Negative.    Cardiovascular: Negative.   Gastrointestinal: Negative.   Endocrine: Negative.   Genitourinary: Negative.   Musculoskeletal:  Positive for back pain (intermittent).  Skin: Negative.   Neurological: Negative.   Psychiatric/Behavioral: Negative.         Objective:    BP 118/80 (BP Location: Left Arm)   Pulse 89   Temp (!) 97 F (36.1 C)   Ht 5\' 4"  (1.626 m)   Wt 180 lb 6.4 oz (81.8 kg)   SpO2 99%   BMI 30.97 kg/m   Wt Readings from Last 3 Encounters:  08/27/23 180 lb 6.4 oz (81.8 kg)  05/27/23 177 lb 12.8 oz (80.6 kg)  05/04/23 177 lb (80.3 kg)    BP Readings from Last 3 Encounters:  08/27/23 118/80  05/27/23 106/80  05/05/23 120/72    Physical Exam Vitals and nursing note reviewed.  Constitutional:      General: She is not in acute distress.    Appearance: Normal appearance.  HENT:     Head: Normocephalic and atraumatic.  Eyes:     Conjunctiva/sclera: Conjunctivae normal.  Cardiovascular:     Rate and Rhythm: Normal rate and regular rhythm.     Pulses: Normal pulses.     Heart sounds: Normal heart sounds.  Pulmonary:     Effort: Pulmonary effort is normal.     Breath sounds: Normal breath sounds.  Abdominal:     Palpations: Abdomen is soft.     Tenderness: There is no abdominal tenderness.  Musculoskeletal:        General: Normal range of motion.     Cervical back: Normal range of motion and neck supple.     Right lower leg: No edema.      Left lower leg: No edema.  Lymphadenopathy:     Cervical: No cervical adenopathy.  Skin:    General: Skin is warm and dry.  Neurological:     General: No focal deficit present.     Mental Status: She is alert and oriented  to person, place, and time.     Cranial Nerves: No cranial nerve deficit.     Coordination: Coordination normal.     Gait: Gait normal.  Psychiatric:        Mood and Affect: Mood normal.        Behavior: Behavior normal.        Thought Content: Thought content normal.        Judgment: Judgment normal.        Assessment & Plan:   Problem List Items Addressed This Visit       Cardiovascular and Mediastinum   Chronic migraine without aura without status migrainosus, not intractable    She reports migraines occurring approximately three times per week. She was previously on Topamax, which was effective but discontinued by prior provider for unknown reasons. We discussed that if she is looking for pregnancy, that topamax is not indicated.  We discussed starting Propranolol 60mg  for daily migraine prevention and considering Rizatriptan as needed for acute migraines.      Relevant Medications   propranolol ER (INDERAL LA) 60 MG 24 hr capsule     Endocrine   Diabetes mellitus without complication (HCC) - Primary    Her last A1c was 6.7, which was higher than it has been. She reported previous success with Victoza but discussed the option of starting Ozempic, a once-weekly injection. We will start Ozempic 0.25mg  once weekly for 4 weeks, then increase to 0.5mg  once weekly. We will order a Freestyle Libre continuous glucose monitor, pending insurance approval. If not covered, we will provide a standard glucose monitoring kit. Requesting recent eye exam records. She declines vaccines today. Check CMP, CBC, A1c, lipid panel, and urine microalbumin today.       Relevant Medications   Semaglutide,0.25 or 0.5MG /DOS, (OZEMPIC, 0.25 OR 0.5 MG/DOSE,) 2 MG/1.5ML SOPN    Other Relevant Orders   CBC with Differential/Platelet   Comprehensive metabolic panel   Lipid panel   Hemoglobin A1c   Microalbumin / creatinine urine ratio     Nervous and Auditory   Chronic midline low back pain with sciatica    She reports intermittent severe lower back pain, possibly related to sciatica or disc issues. Since physical therapy was not effective. She will schedule an appointment when the back flares up and consider starting lyrica.         Other   Vitamin D deficiency    She reports a previous diagnosis of Vitamin D deficiency but has not been on supplementation. We will order lab work to check current Vitamin D levels.      Relevant Orders   VITAMIN D 25 Hydroxy (Vit-D Deficiency, Fractures)   History of tubal ligation    She is interested in another pregnancy and wants to discuss her options. Referral placed to GYN.       Relevant Orders   Ambulatory referral to Gynecology   Other Visit Diagnoses     Screen for colon cancer       Cologuard ordered today   Relevant Orders   Cologuard        Follow up plan: Return in about 4 weeks (around 09/24/2023) for Diabetes.  Ryzen Deady A [place] Delauder

## 2023-08-27 NOTE — Assessment & Plan Note (Signed)
She reports migraines occurring approximately three times per week. She was previously on Topamax, which was effective but discontinued by prior provider for unknown reasons. We discussed that if she is looking for pregnancy, that topamax is not indicated.  We discussed starting Propranolol 60mg  for daily migraine prevention and considering Rizatriptan as needed for acute migraines.

## 2023-08-27 NOTE — Assessment & Plan Note (Signed)
She reports intermittent severe lower back pain, possibly related to sciatica or disc issues. Since physical therapy was not effective. She will schedule an appointment when the back flares up and consider starting lyrica.

## 2023-08-27 NOTE — Assessment & Plan Note (Signed)
She reports a previous diagnosis of Vitamin D deficiency but has not been on supplementation. We will order lab work to check current Vitamin D levels.

## 2023-08-27 NOTE — Assessment & Plan Note (Signed)
She is interested in another pregnancy and wants to discuss her options. Referral placed to GYN.

## 2023-08-27 NOTE — Telephone Encounter (Signed)
Patient notified of below message.

## 2023-08-27 NOTE — Assessment & Plan Note (Signed)
Her last A1c was 6.7, which was higher than it has been. She reported previous success with Victoza but discussed the option of starting Ozempic, a once-weekly injection. We will start Ozempic 0.25mg  once weekly for 4 weeks, then increase to 0.5mg  once weekly. We will order a Freestyle Libre continuous glucose monitor, pending insurance approval. If not covered, we will provide a standard glucose monitoring kit. Requesting recent eye exam records. She declines vaccines today. Check CMP, CBC, A1c, lipid panel, and urine microalbumin today.

## 2023-08-27 NOTE — Patient Instructions (Signed)
It was great to see you!  I have placed a referral to GYN.   We are checking your labs today and will let you know the results via mychart/phone.   Start ozempic injection once a week 0.25mg  for 4 weeks then 0.5mg  injection weekly   Make sure you are drinking plenty of water  Start propranolol 1 capsule daily to help prevent headaches.   If you want to try a magnesium supplement at bedtime, this can also help prevent migraines.   Let's follow-up in 4 weeks, sooner if you have concerns.  If a referral was placed today, you will be contacted for an appointment. Please note that routine referrals can sometimes take up to 3-4 weeks to process. Please call our office if you haven't heard anything after this time frame.  Take care,  Rodman Pickle, NP

## 2023-08-28 ENCOUNTER — Encounter: Payer: Self-pay | Admitting: Nurse Practitioner

## 2023-08-28 LAB — COMPREHENSIVE METABOLIC PANEL
ALT: 8 U/L (ref 0–35)
AST: 16 U/L (ref 0–37)
Albumin: 3.9 g/dL (ref 3.5–5.2)
Alkaline Phosphatase: 97 U/L (ref 39–117)
BUN: 8 mg/dL (ref 6–23)
CO2: 28 meq/L (ref 19–32)
Calcium: 9.2 mg/dL (ref 8.4–10.5)
Chloride: 103 meq/L (ref 96–112)
Creatinine, Ser: 0.96 mg/dL (ref 0.40–1.20)
GFR: 70.1 mL/min (ref >60.00)
Glucose, Bld: 91 mg/dL (ref 70–99)
Potassium: 3.9 meq/L (ref 3.5–5.1)
Sodium: 140 meq/L (ref 135–145)
Total Bilirubin: 0.4 mg/dL (ref 0.2–1.2)
Total Protein: 7.3 g/dL (ref 6.0–8.3)

## 2023-08-28 LAB — LIPID PANEL
Cholesterol: 201 mg/dL — ABNORMAL HIGH (ref 0–200)
HDL: 61.5 mg/dL (ref >39.00)
LDL Cholesterol: 109 mg/dL — ABNORMAL HIGH (ref 0–99)
NonHDL: 139.35
Total CHOL/HDL Ratio: 3
Triglycerides: 150 mg/dL — ABNORMAL HIGH (ref 0.0–149.0)
VLDL: 30 mg/dL (ref 0.0–40.0)

## 2023-08-28 LAB — CBC WITH DIFFERENTIAL/PLATELET
Basophils Absolute: 0 10*3/uL (ref 0.0–0.1)
Basophils Relative: 0.7 % (ref 0.0–3.0)
Eosinophils Absolute: 0.3 10*3/uL (ref 0.0–0.7)
Eosinophils Relative: 7.5 % — ABNORMAL HIGH (ref 0.0–5.0)
HCT: 39 % (ref 36.0–46.0)
Hemoglobin: 12.1 g/dL (ref 12.0–15.0)
Lymphocytes Relative: 43 % (ref 12.0–46.0)
Lymphs Abs: 1.9 10*3/uL (ref 0.7–4.0)
MCHC: 31.1 g/dL (ref 30.0–36.0)
MCV: 82.5 fL (ref 78.0–100.0)
Monocytes Absolute: 0.6 10*3/uL (ref 0.1–1.0)
Monocytes Relative: 12.5 % — ABNORMAL HIGH (ref 3.0–12.0)
Neutro Abs: 1.6 10*3/uL (ref 1.4–7.7)
Neutrophils Relative %: 36.3 % — ABNORMAL LOW (ref 43.0–77.0)
Platelets: 244 10*3/uL (ref 150.0–400.0)
RBC: 4.73 Mil/uL (ref 3.87–5.11)
RDW: 15.6 % — ABNORMAL HIGH (ref 11.5–15.5)
WBC: 4.5 10*3/uL (ref 4.0–10.5)

## 2023-08-28 LAB — MICROALBUMIN / CREATININE URINE RATIO
Creatinine,U: 223.8 mg/dL
Microalb Creat Ratio: 0.4 mg/g (ref 0.0–30.0)
Microalb, Ur: 0.8 mg/dL (ref 0.0–1.9)

## 2023-08-28 LAB — VITAMIN D 25 HYDROXY (VIT D DEFICIENCY, FRACTURES): VITD: 9.37 ng/mL — ABNORMAL LOW (ref 30.00–100.00)

## 2023-08-28 LAB — HEMOGLOBIN A1C: Hgb A1c MFr Bld: 6.2 % (ref 4.6–6.5)

## 2023-08-28 MED ORDER — VITAMIN D (ERGOCALCIFEROL) 1.25 MG (50000 UNIT) PO CAPS: 50000.0000 [IU] | ORAL_CAPSULE | ORAL | 1 refills | Status: DC

## 2023-08-28 NOTE — Addendum Note (Signed)
Addended by: Rodman Pickle A on: 08/28/2023 11:54 AM   Modules accepted: Orders

## 2023-08-29 ENCOUNTER — Telehealth: Payer: Self-pay

## 2023-08-29 ENCOUNTER — Other Ambulatory Visit (HOSPITAL_COMMUNITY): Payer: Self-pay

## 2023-08-29 NOTE — Telephone Encounter (Addendum)
Plan does not cover medication prescribed. Please call plan t [phone] to initiate prior auth. Patient ID# [id number] M.  Prior auth for Ozempic 0.25 or 0.5 (2mg /93ml)

## 2023-08-29 NOTE — Telephone Encounter (Signed)
error 

## 2023-08-29 NOTE — Telephone Encounter (Signed)
Pharmacy Patient Advocate Encounter   Received notification from Pt Calls Messages that prior authorization for Ozempic 2mg /43ml is required/requested.   Insurance verification completed.   The patient is insured through  [facility]  .   Per test claim: PA required; PA submitted to above mentioned insurance via CoverMyMeds Key/confirmation #/EOC [id number] Status is pending

## 2023-09-01 NOTE — Telephone Encounter (Signed)
Pharmacy Patient Advocate Encounter  Received notification from [facility] that Prior Authorization for Ozempic 2mg /96ml  has been DENIED.  Full denial letter will be uploaded to the media tab. See denial reason below.   PA #/Case ID/Reference #: [id number]     Per your health plan's criteria, this drug is covered if you meet the following: One of the following: (A) You have tried and failed or did not respond fully to metformin containing drug. (B) You cannot use metformin. (C) You have a type of heart or kidney disease (atherosclerotic cardiovascular disease or chronic kidney disease).

## 2023-09-02 ENCOUNTER — Encounter: Payer: Self-pay | Admitting: Nurse Practitioner

## 2023-09-02 ENCOUNTER — Telehealth: Payer: Self-pay

## 2023-09-02 NOTE — Telephone Encounter (Signed)
I called and spoke with patient and notified her that Ozempic was denied and that I will forward message to Lauren to see what the next steps are.  Also patient she needs a prior auth for Continuous Glucose Sensor (FREESTYLE LIBRE 2 SENSOR  and I will send to prior auth department in a separate encounter

## 2023-09-02 NOTE — Telephone Encounter (Signed)
Prior Emily Glenn is needed for Continuous Glucose Sensor (FREESTYLE LIBRE 2 SENSOR)

## 2023-09-02 NOTE — Telephone Encounter (Signed)
I called patient and she has tried taking Metformin before but it did not work for her and was switched to Victoza with her previous physician.

## 2023-09-02 NOTE — Telephone Encounter (Signed)
Pt checking on status of this message. Her Ozempic. She also was told by her pharmacy she needs a PA  for her Continuous Glucose Sensor (FREESTYLE LIBRE 2 SENSOR) MISC [id number].  Pt at   [phone]

## 2023-09-02 NOTE — Telephone Encounter (Signed)
Pharmacy Patient Advocate Encounter   Received notification from Pt Calls Messages that prior authorization for FreeStyle Libre 2 Sensor is required/requested.   Insurance verification completed.   The patient is insured through [facility] .   Per test claim: PA required; PA submitted to above mentioned insurance via CoverMyMeds Key/confirmation #/EOC [id number] Status is pending

## 2023-09-02 NOTE — Telephone Encounter (Signed)
PA request has been Submitted. New Encounter created for follow up. For additional info see Pharmacy Prior Auth telephone encounter from 09/02/23.

## 2023-09-03 ENCOUNTER — Other Ambulatory Visit (HOSPITAL_COMMUNITY): Payer: Self-pay

## 2023-09-03 NOTE — Telephone Encounter (Signed)
Appeal Faxed to [phone] with confirmation received.

## 2023-09-03 NOTE — Telephone Encounter (Signed)
Appeal faxed and confirmation of fax received.

## 2023-09-03 NOTE — Telephone Encounter (Signed)
Pharmacy Patient Advocate Encounter  Received notification from [facility] that Prior Authorization for FreeStyle Libre 2 Sensor  has been DENIED.  Full denial letter will be uploaded to the media tab. See denial reason below.   PA #/Case ID/Reference #: [id number]   DENIAL REASON: Patient is not insulin-dependent

## 2023-09-03 NOTE — Telephone Encounter (Addendum)
Unable to fax appeal to fax number listed it said number was out of order. I called phone number to get a different fax number and the representative said that was the correct fax number.   Do you have another fax number that I can fax appeal to?

## 2023-09-03 NOTE — Telephone Encounter (Signed)
Awaiting for a different fax number. Previous fax number said out of order.

## 2023-09-03 NOTE — Telephone Encounter (Signed)
Patient states they had denied the prescription.  Please advise of next steps.  Thanks .Dm/cma

## 2023-09-03 NOTE — Telephone Encounter (Signed)
Good morning, You can fax an appeal [phone] or you can call at [phone].

## 2023-09-04 ENCOUNTER — Encounter: Payer: Self-pay | Admitting: Nurse Practitioner

## 2023-09-04 NOTE — Telephone Encounter (Signed)
I called  Emily Glenn due to her Mychart message and asked her about her situation. Emily Glenn said that she has had bloody stools for the past couple of days  with some abdominal discomfort and told her husband this morning and he told her to call her PCP. I asked Emily Glenn if it was ok to transfer her to nurse triage and she said she was at work now and just wanted to let us know. I told Emily Glenn that nurse triage to advise her in the next steps for her care in this situation and was ok and transferred her call to nurse triage line.

## 2023-09-04 NOTE — Telephone Encounter (Signed)
Noted, will await report from triage nurse.

## 2023-09-05 ENCOUNTER — Encounter: Payer: Self-pay | Admitting: Nurse Practitioner

## 2023-09-05 ENCOUNTER — Ambulatory Visit (INDEPENDENT_AMBULATORY_CARE_PROVIDER_SITE_OTHER): Payer: Medicaid Other | Admitting: Nurse Practitioner

## 2023-09-05 VITALS — BP 104/66 | HR 84 | Temp 97.2°F | Ht 64.0 in | Wt 178.4 lb

## 2023-09-05 DIAGNOSIS — R102 Pelvic and perineal pain: Secondary | ICD-10-CM

## 2023-09-05 DIAGNOSIS — K921 Melena: Secondary | ICD-10-CM

## 2023-09-05 MED ORDER — HYDROCORTISONE (PERIANAL) 2.5 % EX CREA: 1.0000 | TOPICAL_CREAM | Freq: Two times a day (BID) | CUTANEOUS | 0 refills | Status: DC

## 2023-09-05 NOTE — Patient Instructions (Signed)
It was great to see you!  Start proctozone cream twice a day, you will use an applicator to insert this into your rectum for 6 days  Bring [place] a stool sample Monday -Friday 8am-5pm.   Let's follow-up if symptoms worsen or any concerns.   Take care,  Rodman Pickle, NP

## 2023-09-05 NOTE — Progress Notes (Signed)
Acute Office Visit  Subjective:     Patient ID: Emily Glenn, female    DOB: Sep 14, 1976, 47 y.o.   MRN: [id number]  Chief Complaint  Patient presents with   Blood in Stool    With stomach discomfort that started last Friday/Saturday    HPI Patient is in today for blood in stool with lower pelvic pain for 1 week.   Discussed the use of AI scribe software for clinical note transcription with the patient, who gave verbal consent to proceed.  History of Present Illness   The patient, with a history of colitis, presents with a week-long history of bright red blood in her stool. She describes a discomfort in her lower pelvis that precedes bowel movements. The blood is sometimes mixed in with the stool and sometimes present when wiping. The stools are described as loose, sometimes resembling diarrhea. She denies any rectal burning or itching. This is the second episode of rectal bleeding, with the first occurring approximately two months prior and resolving spontaneously after a few days. She also reports fatigue.      ROS See pertinent positives and negatives per HPI.     Objective:    BP 104/66 (BP Location: Left Arm)   Pulse 84   Temp (!) 97.2 F (36.2 C)   Ht 5\' 4"  (1.626 m)   Wt 178 lb 6.4 oz (80.9 kg)   LMP 08/14/2023 (Exact Date)   SpO2 100%   BMI 30.62 kg/m    Physical Exam Vitals and nursing note reviewed.  Constitutional:      General: She is not in acute distress.    Appearance: Normal appearance.  HENT:     Head: Normocephalic.  Eyes:     Conjunctiva/sclera: Conjunctivae normal.  Cardiovascular:     Rate and Rhythm: Normal rate and regular rhythm.     Pulses: Normal pulses.     Heart sounds: Normal heart sounds.  Pulmonary:     Effort: Pulmonary effort is normal.     Breath sounds: Normal breath sounds.  Abdominal:     Palpations: Abdomen is soft.     Tenderness: There is abdominal tenderness (pelvic area). There is no guarding.     Hernia: No hernia is  present.  Musculoskeletal:     Cervical back: Normal range of motion.  Skin:    General: Skin is warm.  Neurological:     General: No focal deficit present.     Mental Status: She is alert and oriented to person, place, and time.  Psychiatric:        Mood and Affect: Mood normal.        Behavior: Behavior normal.        Thought Content: Thought content normal.        Judgment: Judgment normal.       Assessment & Plan:   Assessment and Plan    Rectal Bleeding/Pelvic Pain She has recurrent episodes of bright red blood in her stool accompanied by lower pelvic discomfort, without pain, itching, or other symptoms. Her stool is loose, sometimes resembling diarrhea. A history of colitis was noted on a CT scan in 2019. We will collect stool samples for testing to rule out infection and start Proctozone (hydrocortisone rectal cream) twice daily for 6 days to reduce inflammation and bleeding. A follow-up appointment is scheduled for 09/24/2023. If symptoms do not improve, we will consider a CT scan.       Meds ordered this encounter  Medications  hydrocortisone (PROCTOZONE-HC) 2.5 % rectal cream    Sig: Place 1 Application rectally 2 (two) times daily.    Dispense:  30 g    Refill:  0    Return if symptoms worsen or fail to improve.  Gerre Scull, NP

## 2023-09-10 LAB — CDIFF NAA+O+P+STOOL CULTURE
E coli, Shiga toxin Assay: NEGATIVE
Toxigenic C. Difficile by PCR: NEGATIVE

## 2023-09-12 NOTE — Telephone Encounter (Signed)
Noted  

## 2023-09-18 NOTE — Telephone Encounter (Signed)
Patient had sent a Mychart message and I called patient and I told her that I will call her insurance to find out what is going on.  I called insurance company and was given another phone number for appeals [phone] and  I was transferred. I still did not get an answer on the appeal status.

## 2023-09-22 MED ORDER — DEXCOM G7 SENSOR MISC: 0 refills | Status: DC

## 2023-09-22 MED ORDER — DEXCOM G7 RECEIVER DEVI: 1.0000 | 0 refills | Status: DC

## 2023-09-22 MED ORDER — TRULICITY 0.75 MG/0.5ML ~~LOC~~ SOAJ: 0.7500 mg | SUBCUTANEOUS | 0 refills | Status: DC

## 2023-09-22 NOTE — Telephone Encounter (Signed)
I called and spoke with patient and notified her of below message and if she has any issues with prescriptions to call me back.

## 2023-09-22 NOTE — Telephone Encounter (Signed)
I was following up with patient with awaiting for the final answer on the appeal of Ozempic with determination by 10/03/2023. Patient is requesting some type of insulin as she has been without for 1 month and questions if a Rx of OmniPod could be prescribed since her Rx of Freestyle Josephine Igo was denied.

## 2023-09-24 ENCOUNTER — Telehealth: Payer: Self-pay

## 2023-09-24 ENCOUNTER — Encounter: Payer: Self-pay | Admitting: Nurse Practitioner

## 2023-09-24 ENCOUNTER — Ambulatory Visit: Payer: Medicaid Other | Admitting: Nurse Practitioner

## 2023-09-24 ENCOUNTER — Other Ambulatory Visit (HOSPITAL_COMMUNITY): Payer: Self-pay

## 2023-09-24 VITALS — BP 108/80 | HR 86 | Temp 97.7°F | Ht 64.0 in | Wt 177.0 lb

## 2023-09-24 DIAGNOSIS — E119 Type 2 diabetes mellitus without complications: Secondary | ICD-10-CM | POA: Diagnosis not present

## 2023-09-24 DIAGNOSIS — G43709 Chronic migraine without aura, not intractable, without status migrainosus: Secondary | ICD-10-CM | POA: Diagnosis not present

## 2023-09-24 DIAGNOSIS — G8929 Other chronic pain: Secondary | ICD-10-CM | POA: Diagnosis not present

## 2023-09-24 DIAGNOSIS — M5442 Lumbago with sciatica, left side: Secondary | ICD-10-CM | POA: Diagnosis not present

## 2023-09-24 MED ORDER — GABAPENTIN 300 MG PO CAPS
300.0000 mg | ORAL_CAPSULE | Freq: Two times a day (BID) | ORAL | 1 refills | Status: DC
  Filled 2023-09-24: qty 60, 30d supply, fill #0

## 2023-09-24 MED ORDER — GABAPENTIN 300 MG PO CAPS: 300.0000 mg | ORAL_CAPSULE | Freq: Two times a day (BID) | ORAL | 1 refills | Status: DC

## 2023-09-24 MED ORDER — DEXCOM G7 SENSOR MISC
0 refills | Status: DC
  Filled 2023-09-24: qty 3, 30d supply, fill #0

## 2023-09-24 MED ORDER — DEXCOM G7 RECEIVER DEVI
1.0000 | 0 refills | Status: DC
  Filled 2023-09-24: qty 1, 30d supply, fill #0

## 2023-09-24 MED ORDER — TRULICITY 0.75 MG/0.5ML ~~LOC~~ SOAJ
0.7500 mg | SUBCUTANEOUS | 0 refills | Status: DC
  Filled 2023-09-24: qty 2, 28d supply, fill #0

## 2023-09-24 NOTE — Patient Instructions (Addendum)
It was great to see you!  Start back exercises daily (after you stop hurting right now)  Start gabapentin 1 capsule at bedtime for 3 days, then you can take it twice a day  I have placed a referral to the neurosurgeon (back specialist)  We will work on the prior authorization for the trulicity.   You were referred to GYN at [facility]:   [address] [place] [address]  [phone]   Let's follow-up in 2 months, sooner if you have concerns.  If a referral was placed today, you will be contacted for an appointment. Please note that routine referrals can sometimes take up to 3-4 weeks to process. Please call our office if you haven't heard anything after this time frame.  Take care,  Rodman Pickle, NP

## 2023-09-24 NOTE — Progress Notes (Signed)
Established Patient Office Visit  Subjective   Patient ID: Emily Glenn, female    DOB: 16-Jan-1976  Age: 47 y.o. MRN: [id number]  Chief Complaint  Patient presents with   Diabetes    Follow up, concerns with getting medication filled    HPI  Discussed the use of AI scribe software for clinical note transcription with the patient, who gave verbal consent to proceed.  History of Present Illness   The patient, with a history of lumbar disc disease and stenosis, presents with a two-week history of left leg and lower back pain. The pain, previously in the right leg, has now shifted to the left leg. The pain radiates down to the foot, causing numbness and tingling. The patient reports that the pain is so severe that over-the-counter pain relievers like Tylenol are ineffective. The pain flares up every month and a half, and the patient reports that physical therapy and injections have not provided relief. The patient also reports that excessive physical activity can trigger a flare-up. She denies incontinence, injuries, and fever.   In addition to the leg and back pain, the patient also reports a history of headaches, which have been improving with propranolol. The patient experiences these headaches about once a week. The patient also reports a history of rectal bleeding and blood in the stools, which has resolved.        ROS See pertinent positives and negatives per HPI.    Objective:     BP 108/80 (BP Location: Left Arm)   Pulse 86   Temp 97.7 F (36.5 C)   Ht 5\' 4"  (1.626 m)   Wt 177 lb (80.3 kg)   LMP 08/14/2023 (Exact Date)   SpO2 99%   BMI 30.38 kg/m    Physical Exam Vitals and nursing note reviewed.  Constitutional:      General: She is not in acute distress.    Appearance: Normal appearance.  HENT:     Head: Normocephalic.  Eyes:     Conjunctiva/sclera: Conjunctivae normal.  Cardiovascular:     Rate and Rhythm: Normal rate and regular rhythm.     Pulses: Normal  pulses.     Heart sounds: Normal heart sounds.  Pulmonary:     Effort: Pulmonary effort is normal.     Breath sounds: Normal breath sounds.  Musculoskeletal:        General: No tenderness.     Cervical back: Normal range of motion.     Comments: ROM limited due to pain, straight leg raise positive on left  Skin:    General: Skin is warm.  Neurological:     General: No focal deficit present.     Mental Status: She is alert and oriented to person, place, and time.  Psychiatric:        Mood and Affect: Mood normal.        Behavior: Behavior normal.        Thought Content: Thought content normal.        Judgment: Judgment normal.    The 10-year ASCVD risk score (Arnett DK, et al., 2019) is: 1.5%    Assessment & Plan:   Problem List Items Addressed This Visit       Cardiovascular and Mediastinum   Chronic migraine without aura without status migrainosus, not intractable    Her migraines have shown improvement with Propranolol. She will continue taking Propranolol 60mg  daily.       Relevant Medications   gabapentin (NEURONTIN) 300 MG capsule  Endocrine   Diabetes mellitus without complication (HCC)    Chronic, stable. Her insurance is reviewing the appeal for ozempic on 10/03/23. In the meantime have ordered trulicity 0.75mg  injection weekly and she needs prior authorization on this. Reached out to the prior auth team to get this completed.       Relevant Medications   Dulaglutide (TRULICITY) 0.75 MG/0.5ML SOAJ     Nervous and Auditory   Chronic midline low back pain with sciatica - Primary    She exhibits recurrent episodes of left leg pain with numbness and tingling extending to the foot, and MRI results indicate progressive disc disease with mild to moderate stenosis. Previous treatments, including injections and physical therapy, have failed to provide relief. We will start Gabapentin, 300mg  capsule at bedtime for three days, increasing to twice a day as needed. A  referral to a neurosurgeon for further evaluation and management is necessary. She should begin back exercises daily once the current flare resolves.      Relevant Medications   gabapentin (NEURONTIN) 300 MG capsule   Other Relevant Orders   Ambulatory referral to Neurosurgery    Return in about 2 months (around 11/24/2023).    Gerre Scull, NP

## 2023-09-24 NOTE — Assessment & Plan Note (Signed)
Her migraines have shown improvement with Propranolol. She will continue taking Propranolol 60mg  daily.

## 2023-09-24 NOTE — Assessment & Plan Note (Signed)
She exhibits recurrent episodes of left leg pain with numbness and tingling extending to the foot, and MRI results indicate progressive disc disease with mild to moderate stenosis. Previous treatments, including injections and physical therapy, have failed to provide relief. We will start Gabapentin, 300mg  capsule at bedtime for three days, increasing to twice a day as needed. A referral to a neurosurgeon for further evaluation and management is necessary. She should begin back exercises daily once the current flare resolves.

## 2023-09-24 NOTE — Telephone Encounter (Signed)
Pharmacy Patient Advocate Encounter   Received notification from CoverMyMeds that prior authorization for Dexcom G7 Sensor is required/requested.   Insurance verification completed.   The patient is insured through [facility] .   Per test claim: PA required; PA submitted to above mentioned insurance via CoverMyMeds Key/confirmation #/EOC [id number] Status is pending

## 2023-09-24 NOTE — Assessment & Plan Note (Signed)
Chronic, stable. Her insurance is reviewing the appeal for ozempic on 10/03/23. In the meantime have ordered trulicity 0.75mg  injection weekly and she needs prior authorization on this. Reached out to the prior auth team to get this completed.

## 2023-09-24 NOTE — Telephone Encounter (Signed)
Pharmacy Patient Advocate Encounter   Received notification from Pt Calls Messages that prior authorization for Trulicity 0.75mg /0.67ml is required/requested.   Insurance verification completed.   The patient is insured through [facility] .   Per test claim: PA required; PA submitted to above mentioned insurance via CoverMyMeds Key/confirmation #/EOC [id number] Status is pending

## 2023-09-24 NOTE — Telephone Encounter (Signed)
Patient said that Prior Auth is needed for Trulicity 0.75mg .

## 2023-09-24 NOTE — Telephone Encounter (Signed)
Pharmacy Patient Advocate Encounter   Received notification from CoverMyMeds that prior authorization for Dexcom G7 receiver is required/requested.   Insurance verification completed.   The patient is insured through [facility] .   Per test claim: PA required; PA submitted to above mentioned insurance via CoverMyMeds Key/confirmation #/EOC [id number] Status is pending

## 2023-09-26 ENCOUNTER — Other Ambulatory Visit (HOSPITAL_COMMUNITY): Payer: Self-pay

## 2023-09-26 ENCOUNTER — Encounter (HOSPITAL_COMMUNITY): Payer: Self-pay

## 2023-09-29 ENCOUNTER — Other Ambulatory Visit (HOSPITAL_COMMUNITY): Payer: Self-pay

## 2023-09-29 NOTE — Telephone Encounter (Signed)
Pharmacy Patient Advocate Encounter  Received notification from [facility] that Prior Authorization for Trulicity  has been DENIED.  See denial reason below. No denial letter attached in CMM. Will attach denial letter to Media tab once received.   PA #/Case ID/Reference #: [id number]

## 2023-09-29 NOTE — Telephone Encounter (Signed)
Pharmacy Patient Advocate Encounter  Received notification from [facility] that Prior Authorization for Dexcom G7 sensor has been DENIED.  See denial reason below. No denial letter attached in CMM. Will attach denial letter to Media tab once received.   PA #/Case ID/Reference #: [id number]

## 2023-09-29 NOTE — Telephone Encounter (Signed)
Pharmacy Patient Advocate Encounter  Received notification from [facility] that Prior Authorization for [facility] G7 Receiver device has been DENIED.  See denial reason below. No denial letter attached in CMM. Will attach denial letter to Media tab once received.   PA #/Case ID/Reference #: [id number]

## 2023-09-29 NOTE — Telephone Encounter (Signed)
I called and spoke with patient and she said that she tried Metformin before with previous PCP and it did not help. Hs e said it tore her stomach up and her A1C stayed the same.

## 2023-09-29 NOTE — Telephone Encounter (Signed)
Appeal has been submitted for Trulicty. Will advise when response is received or follow up in 1 week. Please be advised that most companies may take 30 days or longer to make a decision. Appeal letter and all pertinent information was faxed to [phone] on 09/29/2023 @3 :50 pm.  Dellie Burns, PharmD Clinical Pharmacist [place]   Direct Dial: [phone]

## 2023-09-29 NOTE — Telephone Encounter (Signed)
Patient notified of below message and will call her insurance company.

## 2023-10-02 NOTE — Telephone Encounter (Signed)
[employer]/ [phone] Emily Glenn  She needs clarification she has conflicting information about the appeal. One part says about the sensor and the other says about the Trulicity.  Please call to clarify.

## 2023-10-02 NOTE — Telephone Encounter (Signed)
I called and LVM for Emily Glenn to return call.

## 2023-10-03 ENCOUNTER — Telehealth: Payer: Self-pay | Admitting: Nurse Practitioner

## 2023-10-03 NOTE — Telephone Encounter (Signed)
Please give the pt a call . She is returning your call

## 2023-10-03 NOTE — Telephone Encounter (Signed)
LVM for patient to return call. 

## 2023-10-03 NOTE — Telephone Encounter (Signed)
I called and spoke with Emily Glenn with patient's insurance and she is confusedo n the appeal and just wanted clarification. She also said that patient needs to fill out and send back form that was sent to her on 09/03/2023 for consent discuss medical with her PCP. [name] said that as of today she has to withdraw the appeal and patient will need to contact her at [phone].  I called patient and no voicemail. I will try again later.

## 2023-10-03 NOTE — Telephone Encounter (Signed)
I returned patient's call and she said that she did not call.

## 2023-10-07 NOTE — Telephone Encounter (Signed)
I called and spoke with patient and notified her of below message and gave her Brooke's phone number and she will call and speak with Nehemiah Settle to get a form sent to her.

## 2023-10-08 NOTE — Telephone Encounter (Signed)
Patient aware of below message and was going to call representative to have form mailed to fill out.

## 2023-10-08 NOTE — Telephone Encounter (Signed)
Noted  

## 2023-10-08 NOTE — Telephone Encounter (Signed)
Placed a call to the insurance to check the status of the appeal.   Per the representative, there was a form sent to the patient and to the office for the patient to provide written consent and it needs to be filled out and sent back to the insurance before the appeal can be done.   Call reference# [id number]

## 2023-10-09 ENCOUNTER — Telehealth: Payer: Self-pay | Admitting: Nurse Practitioner

## 2023-10-09 NOTE — Telephone Encounter (Signed)
I returned patient's call and she said that Nehemiah Settle has not returned her calls and she has left several messages. I told patient that I would try to contact Brooke.

## 2023-10-09 NOTE — Telephone Encounter (Signed)
LVM for Brooke to return call.

## 2023-10-09 NOTE — Telephone Encounter (Signed)
error 

## 2023-10-09 NOTE — Telephone Encounter (Signed)
Pt called today @ 2:12 asking can you give her a call back

## 2023-10-13 ENCOUNTER — Telehealth: Payer: Self-pay

## 2023-10-13 NOTE — Telephone Encounter (Signed)
LVM to return call.

## 2023-10-13 NOTE — Telephone Encounter (Signed)
Omnipod 5 DexG7G6 Intro Kit (Gen 5)  I put fax in your office.

## 2023-10-14 ENCOUNTER — Encounter: Payer: Self-pay | Admitting: Nurse Practitioner

## 2023-10-14 NOTE — Telephone Encounter (Signed)
Form faxed

## 2023-10-14 NOTE — Telephone Encounter (Signed)
 Care team updated and letter sent for eye exam notes.

## 2023-10-14 NOTE — Telephone Encounter (Signed)
I called patient because we received form from [facility] from appeal depart that patient will need to sign in order for appeals to be done. Patient said she will stop by the office to sign form.

## 2023-10-14 NOTE — Telephone Encounter (Signed)
Had a message that patient topped by office while I was at lunch. I called patient and LVM for patient to return call.

## 2023-10-17 ENCOUNTER — Telehealth: Payer: Self-pay

## 2023-10-17 NOTE — Telephone Encounter (Signed)
LVM for patient to return call. 

## 2023-10-17 NOTE — Telephone Encounter (Signed)
Patient is requesting a rapid active insulin that is compatible for Dexcom G7G6 to be sent to her pharmacy.

## 2023-10-20 ENCOUNTER — Telehealth: Payer: Self-pay

## 2023-10-20 NOTE — Telephone Encounter (Signed)
Patient notified of below message and will call the company that she is getting the Dexcom G7G6 from and see which one they recommend.

## 2023-10-20 NOTE — Telephone Encounter (Signed)
Copied from CRM [id number]. Topic: Clinical - Medication Question >> Oct 20, 2023 12:45 PM Elizebeth Brooking wrote: Reason for CRM: Patient call back requesting a call back from brittney .

## 2023-10-20 NOTE — Telephone Encounter (Signed)
I returned patient's call and documented it under 10/17/23 phone encounter.

## 2023-11-07 ENCOUNTER — Telehealth: Payer: Self-pay

## 2023-11-07 NOTE — Telephone Encounter (Signed)
 Copied from CRM [id number]. Topic: Clinical - Medication Question >> Nov 07, 2023 12:40 PM Suzette B wrote: Reason for CRM: Appeals department from [employer] appeal was submitted for the Ozempic , and needed to speak with someone in the office.   [phone] Shilpa, Urgency in regarding this call needing to speak to someone asap.

## 2023-11-07 NOTE — Telephone Encounter (Signed)
 I called and spoke with Shilpa, from [employer] and she wanted to know if patient had an intolerance to Metformin or just did not like it and I notified her it was an intolerance.

## 2023-11-14 NOTE — Telephone Encounter (Signed)
I called and spoke with [place] a representative at [facility] to check on the appeal decision for patient. Patient was approved for Ozempic from 11/07/23-11/06/24. A letter was mailed to patient on 11/08/2023 and I asked a letter could be faxed to our office as well.  Call reference number [id number].

## 2023-11-14 NOTE — Telephone Encounter (Signed)
I called and spoke with pharmacist and Rx of Ozempic went through with a co-pay of $4.00.

## 2023-11-21 ENCOUNTER — Other Ambulatory Visit: Payer: Self-pay | Admitting: Nurse Practitioner

## 2023-12-12 ENCOUNTER — Telehealth: Payer: Self-pay | Admitting: Pharmacy Technician

## 2023-12-12 ENCOUNTER — Other Ambulatory Visit (HOSPITAL_COMMUNITY): Payer: Self-pay

## 2023-12-12 NOTE — Telephone Encounter (Signed)
Pharmacy Patient Advocate Encounter   Received notification from CoverMyMeds that prior authorization for Dexcom G7 Sensor is required/requested.   Insurance verification completed.   The patient is insured through [facility] .   Per test claim: PA required; PA submitted to above mentioned insurance via CoverMyMeds Key/confirmation #/EOC [id number] Status is pending

## 2023-12-12 NOTE — Telephone Encounter (Signed)
Pharmacy Patient Advocate Encounter  Received notification from [facility] that Prior Authorization for Dexcom G7 Sensor  has been DENIED.  Full denial letter will be uploaded to the media tab. See denial reason below.   PA #/Case ID/Reference #: [id number]

## 2023-12-15 NOTE — Telephone Encounter (Signed)
 I called and spoke with patient and notified her of below message. She will also reach out to her insurance company too. Patient also expressed that she has had some abdominal pain since starting on the ozempic and wanted to schedule an office visit to be seen. I scheduled patient for tomorrow since she could not come today to see Dr. Doreene Burke.

## 2023-12-15 NOTE — Telephone Encounter (Signed)
Noted, agree with office visit.

## 2023-12-16 ENCOUNTER — Telehealth: Payer: Self-pay | Admitting: Nurse Practitioner

## 2023-12-16 ENCOUNTER — Ambulatory Visit: Payer: Medicaid Other | Admitting: Family Medicine

## 2023-12-17 DIAGNOSIS — H209 Unspecified iridocyclitis: Secondary | ICD-10-CM | POA: Diagnosis not present

## 2023-12-17 DIAGNOSIS — Z794 Long term (current) use of insulin: Secondary | ICD-10-CM | POA: Diagnosis not present

## 2023-12-17 DIAGNOSIS — E119 Type 2 diabetes mellitus without complications: Secondary | ICD-10-CM | POA: Diagnosis not present

## 2023-12-23 NOTE — Telephone Encounter (Signed)
 12/16/2023 1st no show with Dr. Doreene Burke, letter sent via [facility]

## 2023-12-24 NOTE — Telephone Encounter (Signed)
 Noted.

## 2023-12-30 DIAGNOSIS — E119 Type 2 diabetes mellitus without complications: Secondary | ICD-10-CM | POA: Diagnosis not present

## 2023-12-30 DIAGNOSIS — Z79899 Other long term (current) drug therapy: Secondary | ICD-10-CM | POA: Diagnosis not present

## 2023-12-30 DIAGNOSIS — H44112 Panuveitis, left eye: Secondary | ICD-10-CM | POA: Diagnosis not present

## 2023-12-30 DIAGNOSIS — H3581 Retinal edema: Secondary | ICD-10-CM | POA: Diagnosis not present

## 2024-01-26 ENCOUNTER — Other Ambulatory Visit: Payer: Self-pay | Admitting: Nurse Practitioner

## 2024-01-26 NOTE — Telephone Encounter (Signed)
 Requesting: OZEMPIC 0.25 OR 0.5MG  DOS(2MG /3ML)  Last Visit: 09/24/2023 Next Visit: Visit date not found Last Refill: 08/27/2023  Please Advise

## 2024-01-27 NOTE — Telephone Encounter (Signed)
 I called and spoke with patient and she said that she is taking Ozempic no Trulicity.

## 2024-02-02 DIAGNOSIS — K0530 Chronic periodontitis, unspecified: Secondary | ICD-10-CM | POA: Diagnosis not present

## 2024-02-03 DIAGNOSIS — Z1384 Encounter for screening for dental disorders: Secondary | ICD-10-CM | POA: Diagnosis not present

## 2024-02-25 ENCOUNTER — Ambulatory Visit (INDEPENDENT_AMBULATORY_CARE_PROVIDER_SITE_OTHER): Admitting: Nurse Practitioner

## 2024-02-25 ENCOUNTER — Encounter: Payer: Self-pay | Admitting: Nurse Practitioner

## 2024-02-25 VITALS — BP 118/76 | HR 69 | Temp 97.4°F | Ht 64.0 in | Wt 177.4 lb

## 2024-02-25 DIAGNOSIS — Z1231 Encounter for screening mammogram for malignant neoplasm of breast: Secondary | ICD-10-CM | POA: Diagnosis not present

## 2024-02-25 DIAGNOSIS — M25512 Pain in left shoulder: Secondary | ICD-10-CM | POA: Insufficient documentation

## 2024-02-25 DIAGNOSIS — J302 Other seasonal allergic rhinitis: Secondary | ICD-10-CM | POA: Insufficient documentation

## 2024-02-25 DIAGNOSIS — Z1211 Encounter for screening for malignant neoplasm of colon: Secondary | ICD-10-CM | POA: Diagnosis not present

## 2024-02-25 DIAGNOSIS — E119 Type 2 diabetes mellitus without complications: Secondary | ICD-10-CM

## 2024-02-25 DIAGNOSIS — Z7985 Long-term (current) use of injectable non-insulin antidiabetic drugs: Secondary | ICD-10-CM | POA: Insufficient documentation

## 2024-02-25 LAB — POCT GLYCOSYLATED HEMOGLOBIN (HGB A1C)
HbA1c POC (<> result, manual entry): 6 % (ref 4.0–5.6)
HbA1c, POC (controlled diabetic range): 6 % (ref 0.0–7.0)
HbA1c, POC (prediabetic range): 6 % (ref 5.7–6.4)
Hemoglobin A1C: 6 % — AB (ref 4.0–5.6)

## 2024-02-25 MED ORDER — ALBUTEROL SULFATE HFA 108 (90 BASE) MCG/ACT IN AERS
2.0000 | INHALATION_SPRAY | Freq: Four times a day (QID) | RESPIRATORY_TRACT | 0 refills | Status: DC | PRN
Start: 2024-02-25 — End: 2024-05-04

## 2024-02-25 MED ORDER — BLOOD GLUCOSE MONITORING SUPPL DEVI: 1.0000 | Freq: Every day | 0 refills | Status: AC

## 2024-02-25 MED ORDER — LANCETS MISC. MISC: 1.0000 | Freq: Every day | 0 refills | Status: AC

## 2024-02-25 MED ORDER — BLOOD GLUCOSE TEST VI STRP: 1.0000 | ORAL_STRIP | Freq: Every day | 0 refills | Status: AC

## 2024-02-25 NOTE — Patient Instructions (Signed)
 It was great to see you!  Start the attached stretches daily  Alternate heat and ice to your shoulder  Keep alternating ibuprofen  and tylenol    Either take claritin twice a day or switch to zyrtec (cetirizine) daily at bedtime   Start albuterol inhaler as needed for wheezing or shortness of breath every 6 hours  Let's follow-up in 6 months, sooner if you have concerns.  If a referral was placed today, you will be contacted for an appointment. Please note that routine referrals can sometimes take up to 3-4 weeks to process. Please call our office if you haven't heard anything after this time frame.  Take care,  Rheba Cedar, NP

## 2024-02-25 NOTE — Assessment & Plan Note (Signed)
Continue ozempic 0.5mg  injection weekly.

## 2024-02-25 NOTE — Assessment & Plan Note (Signed)
 She experiences left shoulder pain for three weeks, likely from sleeping position, with pain on movement but no swelling. Alternating Tylenol  and ibuprofen  provides some relief. Start daily shoulder stretches and alternate heat and ice application. Continue alternating Tylenol  and ibuprofen . Consider physical therapy if no improvement and reassess in six months or sooner if symptoms worsen.

## 2024-02-25 NOTE — Assessment & Plan Note (Signed)
 Chronic, stable. Her diabetes is well-managed with Ozempic  0.5 mg weekly, as indicated by an A1c of 6.0. Insurance denial prevents obtaining a Dexcom, will order a glucometer for monitoring. Check blood glucose weekly or if hypoglycemic symptoms occur. Continue Ozempic  0.5 mg weekly and reassess A1c in six months.

## 2024-02-25 NOTE — Assessment & Plan Note (Signed)
 Pollen exacerbates her allergic rhinitis, and current Claritin use is ineffective. Increase Claritin to twice daily until pollen levels decrease. Consider switching to Zyrtec if Claritin remains ineffective, advising bedtime use due to potential drowsiness. Start albuterol inhaler every 6 hours as needed for shortness of breath or wheezing.

## 2024-02-25 NOTE — Progress Notes (Signed)
 Established Patient Office Visit  Subjective   Patient ID: Emily Glenn, female    DOB: 02-05-1976  Age: 48 y.o. MRN: [id number]  Chief Complaint  Patient presents with   Allergies    Sneezing, itching in both ears, throat itching, left shoulder and left upper arm pain    HPI  Discussed the use of AI scribe software for clinical note transcription with the patient, who gave verbal consent to proceed.  History of Present Illness   Emily Glenn is a 48 year old female with diabetes who presents for follow-up on her diabetes management and shoulder pain.  She manages her diabetes with Ozempic  0.5 mg weekly. She lacks a glucometer due to insurance issues with the Dexcom device. She denies chest pain, shortness of breath, and numbness in her feet.   She experiences shoulder and upper arm pain for three weeks, possibly due to sleeping position. Pain occurs with movements like moving her arm behind her back or lifting it. She alternates Tylenol  and ibuprofen  for relief and uses heat. There is no swelling, and the pain is manageable for daily activities. She believes scoliosis contributes to her shoulder pain and is considering a breast reduction due to back pain.  She has seasonal allergies with sneezing, itching in the ears and throat, and watery eyes. Claritin is ineffective, and she avoids benadryl due to drowsiness. Her asthma, affected by allergies, has not required an inhaler for two years. She has had some intermittent wheezing at home.        ROS See pertinent positives and negatives per HPI.    Objective:     BP 118/76 (BP Location: Left Arm, Patient Position: Sitting, Cuff Size: Normal)   Pulse 69   Temp (!) 97.4 F (36.3 C)   Ht 5\' 4"  (1.626 m)   Wt 177 lb 6.4 oz (80.5 kg)   LMP 02/24/2024 (Exact Date)   SpO2 99%   BMI 30.45 kg/m  BP Readings from Last 3 Encounters:  02/25/24 118/76  09/24/23 108/80  09/05/23 104/66   Wt Readings from Last 3 Encounters:  02/25/24  177 lb 6.4 oz (80.5 kg)  09/24/23 177 lb (80.3 kg)  09/05/23 178 lb 6.4 oz (80.9 kg)      Physical Exam Vitals and nursing note reviewed.  Constitutional:      General: She is not in acute distress.    Appearance: Normal appearance.  HENT:     Head: Normocephalic.     Right Ear: Tympanic membrane, ear canal and external ear normal.     Left Ear: Tympanic membrane, ear canal and external ear normal.     Mouth/Throat:     Mouth: Mucous membranes are moist.     Pharynx: Posterior oropharyngeal erythema present. No oropharyngeal exudate.  Eyes:     Conjunctiva/sclera: Conjunctivae normal.  Cardiovascular:     Rate and Rhythm: Normal rate and regular rhythm.     Pulses: Normal pulses.     Heart sounds: Normal heart sounds.  Pulmonary:     Effort: Pulmonary effort is normal.     Breath sounds: Normal breath sounds.  Musculoskeletal:        General: No swelling or tenderness.     Cervical back: Normal range of motion.     Comments: Left shoulder ROM limited due to pain  Lymphadenopathy:     Cervical: No cervical adenopathy.  Skin:    General: Skin is warm.  Neurological:     General: No focal deficit  present.     Mental Status: She is alert and oriented to person, place, and time.  Psychiatric:        Mood and Affect: Mood normal.        Behavior: Behavior normal.        Thought Content: Thought content normal.        Judgment: Judgment normal.      Results for orders placed or performed in visit on 02/25/24  POCT glycosylated hemoglobin (Hb A1C)  Result Value Ref Range   Hemoglobin A1C 6.0 (A) 4.0 - 5.6 %   HbA1c POC (<> result, manual entry) 6.0 4.0 - 5.6 %   HbA1c, POC (prediabetic range) 6.0 5.7 - 6.4 %   HbA1c, POC (controlled diabetic range) 6.0 0.0 - 7.0 %      The 10-year ASCVD risk score (Arnett DK, et al., 2019) is: 2.2%    Assessment & Plan:   Problem List Items Addressed This Visit       Endocrine   Diabetes mellitus without complication (HCC) -  Primary   Chronic, stable. Her diabetes is well-managed with Ozempic  0.5 mg weekly, as indicated by an A1c of 6.0. Insurance denial prevents obtaining a Dexcom, will order a glucometer for monitoring. Check blood glucose weekly or if hypoglycemic symptoms occur. Continue Ozempic  0.5 mg weekly and reassess A1c in six months.      Relevant Orders   POCT glycosylated hemoglobin (Hb A1C) (Completed)     Other   Acute pain of left shoulder   She experiences left shoulder pain for three weeks, likely from sleeping position, with pain on movement but no swelling. Alternating Tylenol  and ibuprofen  provides some relief. Start daily shoulder stretches and alternate heat and ice application. Continue alternating Tylenol  and ibuprofen . Consider physical therapy if no improvement and reassess in six months or sooner if symptoms worsen.       Seasonal allergies   Pollen exacerbates her allergic rhinitis, and current Claritin use is ineffective. Increase Claritin to twice daily until pollen levels decrease. Consider switching to Zyrtec if Claritin remains ineffective, advising bedtime use due to potential drowsiness. Start albuterol inhaler every 6 hours as needed for shortness of breath or wheezing.      Long-term current use of injectable noninsulin antidiabetic medication   Continue ozempic  0.5mg  injection weekly       Other Visit Diagnoses       Encounter for screening mammogram for malignant neoplasm of breast       Mammogram ordered today, scheduled 04/12/24 at 9:30am   Relevant Orders   MM 3D SCREENING MAMMOGRAM BILATERAL BREAST     Screen for colon cancer       Cologuard ordered today   Relevant Orders   Cologuard       Return in about 6 months (around 08/26/2024) for CPE.    Odette Benjamin, NP

## 2024-04-04 DIAGNOSIS — Z1211 Encounter for screening for malignant neoplasm of colon: Secondary | ICD-10-CM | POA: Diagnosis not present

## 2024-04-08 ENCOUNTER — Telehealth: Payer: Self-pay

## 2024-04-08 ENCOUNTER — Encounter: Admitting: Obstetrics and Gynecology

## 2024-04-08 NOTE — Telephone Encounter (Signed)
 Copied from CRM [id number]. Topic: Referral - Status >> Apr 08, 2024  1:43 PM Emily Glenn wrote: Reason for CRM: Pt advise she had to wait 4 months for Obgyn appt, they called to cancel and advise for her to go back in a month, pt does not want to wait that long as she has been waiting, would like to be referred to another OBGYN in [place] that has sooner availability.

## 2024-04-10 LAB — COLOGUARD: COLOGUARD: POSITIVE — AB

## 2024-04-12 ENCOUNTER — Ambulatory Visit: Payer: Self-pay | Admitting: Nurse Practitioner

## 2024-04-12 ENCOUNTER — Telehealth: Payer: Self-pay

## 2024-04-12 ENCOUNTER — Ambulatory Visit
Admission: RE | Admit: 2024-04-12 | Discharge: 2024-04-12 | Disposition: A | Discharge status: Home / Self Care | Source: Ambulatory Visit

## 2024-04-12 DIAGNOSIS — R195 Other fecal abnormalities: Secondary | ICD-10-CM

## 2024-04-12 DIAGNOSIS — Z1231 Encounter for screening mammogram for malignant neoplasm of breast: Secondary | ICD-10-CM

## 2024-04-12 DIAGNOSIS — N644 Mastodynia: Secondary | ICD-10-CM

## 2024-04-12 NOTE — Telephone Encounter (Unsigned)
 Copied from CRM [id number]. Topic: Referral - Request for Referral >> Apr 12, 2024 10:13 AM Marlan Silva wrote: Did the patient discuss referral with their provider in the last year? Yes (If No - schedule appointment) (If Yes - send message)  Appointment offered? No  Type of order/referral and detailed reason for visit: New OB/GYN referral  Preference of office, provider, location: Requesting recommendations, patient said the one that was sent prior does not have any available appointments.  If referral order, have you been seen by this specialty before? Yes (If Yes, this issue or another issue? When? Where?  Can we respond through MyChart? Yes

## 2024-04-12 NOTE — Telephone Encounter (Unsigned)
 Copied from CRM [id number]. Topic: Referral - Request for Referral >> Apr 12, 2024 10:11 AM Marlan Silva wrote: Did the patient discuss referral with their provider in the last year? Yes (If No - schedule appointment) (If Yes - send message)  Appointment offered? No  Type of order/referral and detailed reason for visit: Diagnostic breast ultrasound (both)  Preference of office, provider, location[facility] [place]  If referral order, have you been seen by this specialty before? No (If Yes, this issue or another issue? When? Where?  Can we respond through MyChart? Yes Patient is requesting that the referral be sent today, because its her only day.

## 2024-04-13 NOTE — Telephone Encounter (Signed)
 I called patient and no answer and no voicemail. I will send patient a mychart message.

## 2024-04-14 NOTE — Telephone Encounter (Signed)
My chart message sen to patient

## 2024-04-20 NOTE — Addendum Note (Signed)
 Addended by: Naoko Diperna A on: 04/20/2024 09:55 AM   Modules accepted: Orders

## 2024-04-21 ENCOUNTER — Other Ambulatory Visit: Payer: Self-pay | Admitting: Nurse Practitioner

## 2024-04-21 DIAGNOSIS — N644 Mastodynia: Secondary | ICD-10-CM

## 2024-05-04 ENCOUNTER — Encounter: Payer: Self-pay | Admitting: Obstetrics and Gynecology

## 2024-05-04 ENCOUNTER — Ambulatory Visit: Admitting: Obstetrics and Gynecology

## 2024-05-04 VITALS — BP 113/79 | HR 83 | Wt 184.0 lb

## 2024-05-04 DIAGNOSIS — R6882 Decreased libido: Secondary | ICD-10-CM

## 2024-05-04 DIAGNOSIS — Z1331 Encounter for screening for depression: Secondary | ICD-10-CM | POA: Diagnosis not present

## 2024-05-04 DIAGNOSIS — F4329 Adjustment disorder with other symptoms: Secondary | ICD-10-CM

## 2024-05-04 NOTE — Progress Notes (Signed)
 CC: Discuss No Sex drive  Discuss Hormones   Had Pap per patient within the last 3 years  - HPV also per patient      Periods are regular

## 2024-05-04 NOTE — Progress Notes (Signed)
   NEW GYNECOLOGY VISIT  Subjective:  Emily Glenn is a 48 y.o. [phone] s/p BTL with LMP 04/13/24 presenting for discussion of low libido.   Reports declining sex drive that significantly dropped off in the past two years. She reports no sexual desire with her husband or independently. Does not use vibrators/sex toys/etc. She denies hot flashes, night sweats, and vaginal dryness. Has some pain with intercourse, does not use a lubricant. She still has a regular menstrual cycle.   This is causing significant stress in her relationship. She reports that she has to force herself to have sex. Her husband has not been supportive in this regard and has made threats to have sex with a new partner or leave the marriage. This has been getting worse for the past few months. She also notes that he has had significant health issues including a stroke in 2017 followed by two heart attacks and a hip replacement. It has been very stressful for her. She is having trouble sleeping because his strokes/MIs were in the middle of the night so she is hypervigilant about how he is breathing in his sleep and will wake up with a start trying to make sure everything is OK. He also has short term memory loss so they will argue about conversations he forgets about and this adds to their marital strain.  She is taking propranolol  daily for migraine prevention. She has diabetes for which she takes ozempic .  Objective:   Vitals:   05/04/24 0945  BP: 113/79  Pulse: 83  Weight: 184 lb (83.5 kg)   General:  Alert, oriented and cooperative. Patient is in no acute distress.  Skin: Skin is warm and dry. No rash noted.   Cardiovascular: Normal heart rate noted  Respiratory: Normal respiratory effort, no problems with respiration noted   Assessment and Plan:  Emily Glenn is a 48 y.o. with low sex drive  1. Low libido (Primary) 2. Adjustment disorder with other symptom Discussed sex drive is multifactorial and includes  components of physical health/medications, mental/emotional health, and relational health. Reviewed that there are components of each that may be contributing to the change in her sex drive including menopause transition, propranolol  use, stress/difficulties with adjusting to husband's health issues, hypervigilance, and lack of emotional support/connection with her husband. We also discussed that painful sex can also make sex drive decrease. Reviewed role for lubricants and pelvic floor PT.  After discussion, she is accepting of [facility] referral. Provided information on how to find family/marital counselor as well as [facility] sex therapist. - [facility] ref to [facility]  Total encounter time: 37 minutes  Return in about 4 weeks (around 06/01/2024) for annual exam with pap smear.  Future Appointments  Date Time Provider Department Center  07/26/2024  1:10 PM Erik Kieth BROCKS, MD [facility]  08/26/2024  9:20 AM Nedra Tinnie LABOR, NP [facility]   Kieth BROCKS Erik, MD

## 2024-05-04 NOTE — Patient Instructions (Addendum)
 [email or web address] - sex therapy  [email or web address] - therapist list

## 2024-05-13 ENCOUNTER — Ambulatory Visit
Admission: RE | Admit: 2024-05-13 | Discharge: 2024-05-13 | Disposition: A | Discharge status: Home / Self Care | Source: Ambulatory Visit | Attending: Nurse Practitioner | Admitting: Nurse Practitioner

## 2024-05-13 ENCOUNTER — Other Ambulatory Visit: Payer: Self-pay | Admitting: Nurse Practitioner

## 2024-05-13 DIAGNOSIS — N644 Mastodynia: Secondary | ICD-10-CM

## 2024-05-13 DIAGNOSIS — N6012 Diffuse cystic mastopathy of left breast: Secondary | ICD-10-CM | POA: Diagnosis not present

## 2024-05-18 ENCOUNTER — Ambulatory Visit: Payer: Self-pay | Admitting: Nurse Practitioner

## 2024-06-15 DIAGNOSIS — H44112 Panuveitis, left eye: Secondary | ICD-10-CM | POA: Diagnosis not present

## 2024-06-15 DIAGNOSIS — E119 Type 2 diabetes mellitus without complications: Secondary | ICD-10-CM | POA: Diagnosis not present

## 2024-06-15 DIAGNOSIS — Z79899 Other long term (current) drug therapy: Secondary | ICD-10-CM | POA: Diagnosis not present

## 2024-06-15 DIAGNOSIS — H3581 Retinal edema: Secondary | ICD-10-CM | POA: Diagnosis not present

## 2024-06-26 ENCOUNTER — Emergency Department (HOSPITAL_COMMUNITY)

## 2024-06-26 ENCOUNTER — Emergency Department (HOSPITAL_COMMUNITY)
Admission: EM | Admit: 2024-06-26 | Discharge: 2024-06-26 | Disposition: A | Discharge status: Home / Self Care | Attending: Emergency Medicine | Admitting: Emergency Medicine

## 2024-06-26 ENCOUNTER — Other Ambulatory Visit: Payer: Self-pay

## 2024-06-26 DIAGNOSIS — M25512 Pain in left shoulder: Secondary | ICD-10-CM | POA: Diagnosis not present

## 2024-06-26 MED ORDER — OXYCODONE-ACETAMINOPHEN 5-325 MG PO TABS: 1.0000 | ORAL_TABLET | Freq: Four times a day (QID) | ORAL | 0 refills | Status: DC | PRN

## 2024-06-26 MED ORDER — OXYCODONE-ACETAMINOPHEN 5-325 MG PO TABS
1.0000 | ORAL_TABLET | ORAL | Status: DC | PRN
  Administered 2024-06-26: 1 via ORAL
  Filled 2024-06-26: qty 1

## 2024-06-26 NOTE — Discharge Instructions (Addendum)
 Return for any problem.   Follow-up with Dr. SHERIDA - orthopedics as instructed.

## 2024-06-26 NOTE — ED Provider Notes (Signed)
 [place] EMERGENCY DEPARTMENT AT [facility] Provider Note   CSN: [id number] Arrival date & time: 06/26/24  2029     Patient presents with: Shoulder Injury   Emily Glenn is a 48 y.o. female.   48 year old female with prior medical history as detailed below presents for evaluation.  Patient was at home.  She nearly fell on the stairs.  She reached out a grabbed the rail with her left hand.  She felt a pop from her left shoulder.  She is concerned that she may have briefly dislocated the shoulder.  She denies other injury.  She reports that the Percocet administered in triage is improving her pain.  The history is provided by the patient and medical records.       Prior to Admission medications   Medication Sig Start Date End Date Taking? Authorizing Provider  azaTHIOprine (IMURAN) 50 MG tablet Take 50 mg by mouth daily. 12/30/23   [provider]  Blood Glucose Monitoring Suppl DEVI 1 each by Does not apply route daily at 6 (six) AM. May substitute to any manufacturer covered by patient's insurance. 02/25/24   McElwee, Lauren A, NP  DUREZOL 0.05 % EMUL Apply 1 drop to eye daily. 12/07/20   [provider]  gabapentin  (NEURONTIN ) 300 MG capsule Take by mouth. 04/27/24   [provider]  propranolol  ER (INDERAL  LA) 60 MG 24 hr capsule Take 60 mg by mouth daily. 04/27/24   [provider]  Semaglutide ,0.25 or 0.5MG /DOS, (OZEMPIC , 0.25 OR 0.5 MG/DOSE,) 2 MG/3ML SOPN Inject 0.5 mg into the skin once a week. 01/27/24   McElwee, Tinnie LABOR, NP    Allergies: Patient has no known allergies.    Review of Systems  All other systems reviewed and are negative.   Updated Vital Signs BP 109/85 (BP Location: Right Arm)   Pulse 87   Temp 99 F (37.2 C)   Resp 17   Ht 5' 4 (1.626 m)   Wt 81.6 kg   LMP 05/17/2024   SpO2 93%   BMI 30.90 kg/m   Physical Exam Vitals and nursing note reviewed.  Constitutional:      General: She is not in acute  distress.    Appearance: Normal appearance. She is well-developed.  HENT:     Head: Normocephalic and atraumatic.  Eyes:     Conjunctiva/sclera: Conjunctivae normal.     Pupils: Pupils are equal, round, and reactive to light.  Cardiovascular:     Rate and Rhythm: Normal rate and regular rhythm.     Heart sounds: Normal heart sounds.  Pulmonary:     Effort: Pulmonary effort is normal. No respiratory distress.     Breath sounds: Normal breath sounds.  Abdominal:     General: There is no distension.     Palpations: Abdomen is soft.     Tenderness: There is no abdominal tenderness.  Musculoskeletal:        General: Tenderness present. No deformity.     Cervical back: Normal range of motion and neck supple.     Comments: Diffuse tenderness to the anterior and lateral aspect of the left shoulder.  No step-off or deformity noted.  Distal left upper extremity is neurovascular intact.  Active range of motion of the left shoulder is reduced secondary to pain.  Skin:    General: Skin is warm and dry.  Neurological:     General: No focal deficit present.     Mental Status: She is alert and  oriented to person, place, and time.     (all labs ordered are listed, but only abnormal results are displayed) Labs Reviewed - No data to display  EKG: None  Radiology: DG Shoulder Left Result Date: 06/26/2024 CLINICAL DATA:  pain EXAM: LEFT SHOULDER - 2+ VIEW COMPARISON:  X-ray left shoulder 04/20/2018. FINDINGS: There is no evidence of fracture or dislocation. There is no evidence of arthropathy or other focal bone abnormality. Soft tissues are unremarkable. IMPRESSION: Negative. Electronically Signed   By: Morgane  Naveau M.D.   On: 06/26/2024 20:53     Procedures   Medications Ordered in the ED  oxyCODONE -acetaminophen  (PERCOCET/ROXICET) 5-325 MG per tablet 1 tablet (1 tablet Oral Given 06/26/24 2036)                                    Medical Decision Making Patient with injury to the  left shoulder.  Imaging does not show dislocation or fracture.  Described injury is suggestive of left shoulder sprain, strain.  It is possible that she briefly dislocated however she is appropriately reduced on examination.  Sling applied.  Patient understands need for close outpatient follow-up with orthopedics.  Strict return precautions given and understood.  Importance of close follow-up is stressed.  Amount and/or Complexity of Data Reviewed Radiology: ordered.  Risk Prescription drug management.        Final diagnoses:  Acute pain of left shoulder    ED Discharge Orders          Ordered    oxyCODONE -acetaminophen  (PERCOCET/ROXICET) 5-325 MG tablet  Every 6 hours PRN        06/26/24 2201               Laurice Maude BROCKS, MD 06/26/24 2203

## 2024-06-26 NOTE — ED Triage Notes (Addendum)
 PT believes she has dislocated her L shoulder.  She was trying to catch herself from falling down the stairs and she heard a pop.  Pulses present.  Hand and digits warm with + cap refill.  PT c/o tingling and numbness to digits of L hand.

## 2024-06-30 ENCOUNTER — Encounter: Payer: Self-pay | Admitting: Nurse Practitioner

## 2024-07-26 ENCOUNTER — Ambulatory Visit: Admitting: Obstetrics and Gynecology

## 2024-07-26 NOTE — Progress Notes (Deleted)
 ANNUAL EXAM Patient name: Emily Glenn MRN [id number]  Date of birth: [birth year] Chief Complaint:   No chief complaint on file.  History of Present Illness:   Emily Glenn is a 48 y.o. [phone] with Patient's last menstrual period was 05/17/2024. being seen today for a routine annual exam.  Current complaints: ***   S/p BTL  Last pap 08/26/2019. Results were: NILM w/ HRHPV negative. H/O abnormal pap: {yes/yes***/no:23866} Last mammogram: 05/13/24 BI RADS 2. Family h/o breast cancer: {yes***/no:23838} Last colonoscopy: ***. Results were: {normal, abnormal, n/a:23837}. Family h/o colorectal cancer: {yes***/no:23838} HPV vaccine: n/a     05/04/2024    9:48 AM 08/27/2023    3:08 PM 05/27/2023    8:34 AM 04/17/2022   10:25 AM 03/06/2021    9:10 AM  Depression screen PHQ 2/9  Decreased Interest 0 0 0 0 0  Down, Depressed, Hopeless 0 0 0 0 0  PHQ - 2 Score 0 0 0 0 0  Altered sleeping 0 2 2 1  0  Tired, decreased energy 1 2 2  0 1  Change in appetite 0 0 0 0 0  Feeling bad or failure about yourself  0 0 0 0 0  Trouble concentrating 0 0 0 0 0  Moving slowly or fidgety/restless 0 0 0 0 0  Suicidal thoughts 0 0 0 0 0  PHQ-9 Score 1 4 4 1 1   Difficult doing work/chores Not difficult at all Somewhat difficult  Somewhat difficult         05/04/2024    9:48 AM 08/27/2023    3:08 PM  GAD 7 : Generalized Anxiety Score  Nervous, Anxious, on Edge 0 0  Control/stop worrying 0 0  Worry too much - different things 0 0  Trouble relaxing 2 2  Restless 0 0  Easily annoyed or irritable 1 1  Afraid - awful might happen 0 0  Total GAD 7 Score 3 3  Anxiety Difficulty Somewhat difficult Somewhat difficult     Review of Systems:   Pertinent items are noted in HPI Denies any headaches, blurred vision, fatigue, shortness of breath, chest pain, abdominal pain, abnormal vaginal discharge/itching/odor/irritation, problems with periods, bowel movements, urination, or intercourse unless otherwise stated  above. Pertinent History Reviewed:  Reviewed past medical,surgical, social and family history.  Reviewed problem list, medications and allergies. Physical Assessment:  There were no vitals filed for this visit.There is no height or weight on file to calculate BMI.        Physical Examination:   General appearance - well appearing, and in no distress  Mental status - alert, oriented to person, place, and time  Chest - respiratory effort normal  Heart - normal peripheral perfusion  Breasts - breasts appear normal, no suspicious masses, no skin or nipple changes or axillary nodes  Abdomen - soft, nontender, nondistended, no masses or organomegaly  Pelvic - VULVA: normal appearing vulva with no masses, tenderness or lesions  VAGINA: normal appearing vagina with normal color and discharge, no lesions  CERVIX: normal appearing cervix without discharge or lesions, no CMT  Thin prep pap is {Desc; done/not:10129} *** HR HPV cotesting  UTERUS: uterus is felt to be normal size, shape, consistency and nontender   ADNEXA: No adnexal masses or tenderness noted.  Chaperone present for exam  No results found for this or any previous visit (from the past 24 hours).  Assessment & Plan:  1) Well-Woman Exam Mammogram: in 1 year, or sooner if problems Colonoscopy: {TCS f/u:25213::@ 48yo},  or sooner if problems Pap: Collected today Gardasil: n/a GC/CT: HIV/HCV:  2) ***  Labs/procedures today: ***  No orders of the defined types were placed in this encounter.   Meds: No orders of the defined types were placed in this encounter.   Follow-up: No follow-ups on file.  Kieth JAYSON Carolin, MD 07/26/2024 10:39 AM

## 2024-08-09 ENCOUNTER — Ambulatory Visit (INDEPENDENT_AMBULATORY_CARE_PROVIDER_SITE_OTHER): Admitting: Internal Medicine

## 2024-08-09 ENCOUNTER — Encounter: Payer: Self-pay | Admitting: Internal Medicine

## 2024-08-09 VITALS — BP 106/76 | HR 85 | Temp 97.8°F | Ht 64.0 in | Wt 177.6 lb

## 2024-08-09 DIAGNOSIS — N939 Abnormal uterine and vaginal bleeding, unspecified: Secondary | ICD-10-CM | POA: Diagnosis not present

## 2024-08-09 DIAGNOSIS — H9202 Otalgia, left ear: Secondary | ICD-10-CM

## 2024-08-09 LAB — POCT URINE PREGNANCY: Preg Test, Ur: NEGATIVE

## 2024-08-09 NOTE — Progress Notes (Signed)
 [facility] [address] [name] [address] Dept: [phone] Dept Fax: [phone]  Acute Care Office Visit  Subjective:   Emily Glenn 14-Feb-1976 08/09/2024  Chief Complaint  Patient presents with   Menorrhagia    Bleeding clotting 2 weeks no pain, left ear pain    HPI:  Discussed the use of AI scribe software for clinical note transcription with the patient, who gave verbal consent to proceed.  History of Present Illness    Emily Glenn is a 48 year old female who presents with prolonged vaginal bleeding.  She has been experiencing vaginal bleeding for the past two weeks, which is significantly longer than her usual menstrual cycle of three to five days. The bleeding began on October 1st, aligning with her typical cycle start date. The bleeding is sometimes heavy with clots, no larger than a quarter, and at other times light, giving the impression it might stop, but then it becomes heavy again. This is her first experience with clots during her menstrual cycle. No history of uterine fibroids and no similar bleeding episodes in the past. She is not on any birth control, new medications. She experiences mild, tolerable cramping associated with the bleeding and reports lower back pain across her lower back. She is sexually active with her husband. Has had a tubal ligation in the past.   She also mentions experiencing intermittent ear pain in her left ear for about a week and a half. The pain is not constant but worsens with each occurrence. She sometimes feels the pain radiating to the left side of her throat. No fever or hearing loss but reports nasal congestion.      The following portions of the patient's history were reviewed and updated as appropriate: past medical history, past surgical history, family history, social history, allergies, medications, and problem list.   Patient Active Problem List   Diagnosis Date  Noted   Acute pain of left shoulder 02/25/2024   Seasonal allergies 02/25/2024   Long-term current use of injectable noninsulin antidiabetic medication 02/25/2024   Chronic migraine without aura without status migrainosus, not intractable 08/27/2023   Vitamin D  deficiency 08/27/2023   History of tubal ligation 08/27/2023   Chronic midline low back pain with sciatica 08/27/2023   Diabetes mellitus without complication (HCC) 03/06/2021   Past Medical History:  Diagnosis Date   Diabetes mellitus without complication (HCC)    Elevated serum creatinine 04/14/2023   Migraine    Refusal of blood transfusions as patient is Jehovah's Witness    Viral URI 04/18/2022   Past Surgical History:  Procedure Laterality Date   TUBAL LIGATION     14 years ago   Family History  Problem Relation Age of Onset   Diabetes Maternal Grandmother    Cancer Maternal Grandmother        neck   High blood pressure Maternal Grandmother    Diabetes Maternal Grandfather    High blood pressure Maternal Grandfather    Cancer Maternal Grandfather    High blood pressure Paternal Grandmother    High blood pressure Paternal Grandfather     Current Outpatient Medications:    azaTHIOprine (IMURAN) 50 MG tablet, Take 50 mg by mouth daily., Disp: , Rfl:    Blood Glucose Monitoring Suppl DEVI, 1 each by Does not apply route daily at 6 (six) AM. May substitute to any manufacturer covered by patient's insurance., Disp: 1 each, Rfl: 0   DUREZOL 0.05 % EMUL, Apply 1  drop to eye daily., Disp: , Rfl:    gabapentin  (NEURONTIN ) 300 MG capsule, Take by mouth., Disp: , Rfl:    propranolol  ER (INDERAL  LA) 60 MG 24 hr capsule, Take 60 mg by mouth daily., Disp: , Rfl:    Semaglutide ,0.25 or 0.5MG /DOS, (OZEMPIC , 0.25 OR 0.5 MG/DOSE,) 2 MG/3ML SOPN, Inject 0.5 mg into the skin once a week., Disp: 3 mL, Rfl: 1   oxyCODONE -acetaminophen  (PERCOCET/ROXICET) 5-325 MG tablet, Take 1 tablet by mouth every 6 (six) hours as needed for severe  pain (pain score 7-10). (Patient not taking: Reported on 08/09/2024), Disp: 15 tablet, Rfl: 0 No Known Allergies   ROS: A complete ROS was performed with pertinent positives/negatives noted in the HPI. The remainder of the ROS are negative.    Objective:   Today's Vitals   08/09/24 1433  BP: 106/76  Pulse: 85  Temp: 97.8 F (36.6 C)  TempSrc: Temporal  SpO2: 100%  Weight: 177 lb 9.6 oz (80.6 kg)  Height: 5' 4 (1.626 m)    GENERAL: Well-appearing, in NAD. Well nourished.  SKIN: Pink, warm and dry. No rash.  HEENT:    HEAD: Normocephalic, non-traumatic.  EYES: Conjunctive pink without exudate.  EARS: External ear w/o redness, swelling, masses, or lesions. EAC clear. TM's intact, translucent w/o bulging, appropriate landmarks visualized.  NECK: Trachea midline. Full ROM w/o pain or tenderness. No lymphadenopathy.  RESPIRATORY: Chest wall symmetrical. Respirations even and non-labored. Breath sounds clear to auscultation bilaterally.  CARDIAC: S1, S2 present, regular rate and rhythm. Peripheral pulses 2+ bilaterally.  GU: External genitalia without erythema, lesions, or masses. No lymphadenopathy. Vaginal mucosa pink and moist without exudate, lesions, or ulcerations. Moderate amount of dark red blood present in vaginal canal. Cervix pink without discharge. Cervical os closed. Uterus and adnexae palpable, not enlarged, and w/o tenderness. No palpable masses. Chaperoned by Avelina Finder CMA EXTREMITIES: Without clubbing, cyanosis, or edema.  NEUROLOGIC: Steady, even gait.  PSYCH/MENTAL STATUS: Alert, oriented x 3. Cooperative, appropriate mood and affect.    Results for orders placed or performed in visit on 08/09/24  POCT urine pregnancy  Result Value Ref Range   Preg Test, Ur Negative Negative      Assessment & Plan:  Assessment and Plan    Vaginal bleeding Prolonged bleeding with clots, no fibroids or recent surgeries, not on birth control.  - Order pelvic ultrasound   - Perform urine pregnancy test to rule out pregnancy. - Check CBC, CMP - Advise to keep upcoming OB GYN appointment on Nov 12th  Left ear pain  Intermittent ear pain with nasal congestion, likely due to sinus congestion and possible fluid backup from seasonal allergies. - Recommend over-the-counter Claritin once daily. - Suggest using Flonase  nasal spray to alleviate congestion.      No orders of the defined types were placed in this encounter.  Orders Placed This Encounter  Procedures   US  PELVIC COMPLETE WITH TRANSVAGINAL    Standing Status:   Future    Expiration Date:   08/09/2025    Reason for Exam (SYMPTOM  OR DIAGNOSIS REQUIRED):   heavy vaginal bleeding    Preferred imaging location?:   [facility]   CBC with Differential/Platelet   Basic Metabolic Panel (BMET)   POCT urine pregnancy   Lab Orders         CBC with Differential/Platelet         Basic Metabolic Panel (BMET)         POCT urine pregnancy  No images are attached to the encounter or orders placed in the encounter.  Return if symptoms worsen or fail to improve.   Rosina Senters, FNP

## 2024-08-09 NOTE — Patient Instructions (Signed)
 Ear ache:  Can try over the counter Claritin 10mg  once daily Flonase  nasal spray 1-2 sprays each nostril 2 times a day as needed for sinus congestion   Vaginal bleeding:  Ultrasound  Urine pregnancy test  Blood work  Follow up with obgyn as scheduled

## 2024-08-10 LAB — BASIC METABOLIC PANEL WITH GFR
BUN: 10 mg/dL (ref 6–23)
CO2: 29 meq/L (ref 19–32)
Calcium: 9.3 mg/dL (ref 8.4–10.5)
Chloride: 103 meq/L (ref 96–112)
Creatinine, Ser: 0.99 mg/dL (ref 0.40–1.20)
GFR: 67.11 mL/min (ref >60.00)
Glucose, Bld: 105 mg/dL — ABNORMAL HIGH (ref 70–99)
Potassium: 3.6 meq/L (ref 3.5–5.1)
Sodium: 139 meq/L (ref 135–145)

## 2024-08-10 LAB — CBC WITH DIFFERENTIAL/PLATELET
Basophils Absolute: 0.1 K/uL (ref 0.0–0.1)
Basophils Relative: 1.2 % (ref 0.0–3.0)
Eosinophils Absolute: 0.5 K/uL (ref 0.0–0.7)
Eosinophils Relative: 7.6 % — ABNORMAL HIGH (ref 0.0–5.0)
HCT: 37.9 % (ref 36.0–46.0)
Hemoglobin: 11.8 g/dL — ABNORMAL LOW (ref 12.0–15.0)
Lymphocytes Relative: 32.7 % (ref 12.0–46.0)
Lymphs Abs: 2.3 K/uL (ref 0.7–4.0)
MCHC: 31.1 g/dL (ref 30.0–36.0)
MCV: 82.3 fl (ref 78.0–100.0)
Monocytes Absolute: 0.8 K/uL (ref 0.1–1.0)
Monocytes Relative: 11.1 % (ref 3.0–12.0)
Neutro Abs: 3.3 K/uL (ref 1.4–7.7)
Neutrophils Relative %: 47.4 % (ref 43.0–77.0)
Platelets: 242 K/uL (ref 150.0–400.0)
RBC: 4.6 Mil/uL (ref 3.87–5.11)
RDW: 16.5 % — ABNORMAL HIGH (ref 11.5–15.5)
WBC: 6.9 K/uL (ref 4.0–10.5)

## 2024-08-11 ENCOUNTER — Ambulatory Visit: Payer: Self-pay | Admitting: Internal Medicine

## 2024-08-18 ENCOUNTER — Ambulatory Visit (HOSPITAL_BASED_OUTPATIENT_CLINIC_OR_DEPARTMENT_OTHER)
Admission: RE | Admit: 2024-08-18 | Discharge: 2024-08-18 | Disposition: A | Discharge status: Home / Self Care | Source: Ambulatory Visit | Attending: Internal Medicine | Admitting: Internal Medicine

## 2024-08-18 DIAGNOSIS — N939 Abnormal uterine and vaginal bleeding, unspecified: Secondary | ICD-10-CM | POA: Insufficient documentation

## 2024-08-24 DIAGNOSIS — Z79899 Other long term (current) drug therapy: Secondary | ICD-10-CM | POA: Diagnosis not present

## 2024-08-24 DIAGNOSIS — H3581 Retinal edema: Secondary | ICD-10-CM | POA: Diagnosis not present

## 2024-08-24 DIAGNOSIS — E119 Type 2 diabetes mellitus without complications: Secondary | ICD-10-CM | POA: Diagnosis not present

## 2024-08-24 DIAGNOSIS — H44112 Panuveitis, left eye: Secondary | ICD-10-CM | POA: Diagnosis not present

## 2024-08-26 ENCOUNTER — Encounter: Admitting: Nurse Practitioner

## 2024-08-30 DIAGNOSIS — E119 Type 2 diabetes mellitus without complications: Secondary | ICD-10-CM | POA: Diagnosis not present

## 2024-08-30 DIAGNOSIS — H209 Unspecified iridocyclitis: Secondary | ICD-10-CM | POA: Diagnosis not present

## 2024-08-30 DIAGNOSIS — Z794 Long term (current) use of insulin: Secondary | ICD-10-CM | POA: Diagnosis not present

## 2024-09-01 ENCOUNTER — Encounter: Payer: Self-pay | Admitting: Nurse Practitioner

## 2024-09-06 ENCOUNTER — Emergency Department (HOSPITAL_BASED_OUTPATIENT_CLINIC_OR_DEPARTMENT_OTHER): Admitting: Radiology

## 2024-09-06 ENCOUNTER — Encounter (HOSPITAL_BASED_OUTPATIENT_CLINIC_OR_DEPARTMENT_OTHER): Payer: Self-pay | Admitting: Emergency Medicine

## 2024-09-06 ENCOUNTER — Other Ambulatory Visit: Payer: Self-pay

## 2024-09-06 ENCOUNTER — Emergency Department (HOSPITAL_BASED_OUTPATIENT_CLINIC_OR_DEPARTMENT_OTHER)
Admission: EM | Admit: 2024-09-06 | Discharge: 2024-09-06 | Disposition: A | Discharge status: Home / Self Care | Attending: Emergency Medicine | Admitting: Emergency Medicine

## 2024-09-06 DIAGNOSIS — M25511 Pain in right shoulder: Secondary | ICD-10-CM | POA: Diagnosis not present

## 2024-09-06 MED ORDER — ONDANSETRON 4 MG PO TBDP
4.0000 mg | ORAL_TABLET | Freq: Once | ORAL | Status: AC
  Administered 2024-09-06: 4 mg via ORAL
  Filled 2024-09-06: qty 1

## 2024-09-06 MED ORDER — OXYCODONE HCL 5 MG PO TABS
5.0000 mg | ORAL_TABLET | ORAL | Status: AC
  Administered 2024-09-06: 5 mg via ORAL
  Filled 2024-09-06: qty 1

## 2024-09-06 MED ORDER — OXYCODONE HCL 5 MG PO TABS: 5.0000 mg | ORAL_TABLET | ORAL | 0 refills | Status: AC | PRN

## 2024-09-06 MED ORDER — KETOROLAC TROMETHAMINE 15 MG/ML IJ SOLN
15.0000 mg | Freq: Once | INTRAMUSCULAR | Status: AC
  Administered 2024-09-06: 15 mg via INTRAMUSCULAR
  Filled 2024-09-06: qty 1

## 2024-09-06 NOTE — ED Provider Notes (Signed)
  [place] EMERGENCY DEPARTMENT AT [facility] Provider Note   CSN: [id number] Arrival date & time: 09/06/24  1600     Patient presents with: Arm Pain   Emily Glenn is a 48 y.o. female.  {Add pertinent medical, surgical, social history, OB history to HPI:32947} Over the past 2 days has been having pain in the right shoulder.  Works for [employer].  Thinks it might be her lifting to the.  Tingling in all of her fingers and goes down her arm.  No neck pain or bowel or bladder incontinence or weakness of her limbs.  Good neurovascular exam but pain limits motor exam.       Prior to Admission medications   Medication Sig Start Date End Date Taking? Authorizing Provider  azaTHIOprine (IMURAN) 50 MG tablet Take 50 mg by mouth daily. 12/30/23   [provider]  Blood Glucose Monitoring Suppl DEVI 1 each by Does not apply route daily at 6 (six) AM. May substitute to any manufacturer covered by patient's insurance. 02/25/24   McElwee, Lauren A, NP  DUREZOL 0.05 % EMUL Apply 1 drop to eye daily. 12/07/20   [provider]  gabapentin  (NEURONTIN ) 300 MG capsule Take by mouth. 04/27/24   [provider]  oxyCODONE -acetaminophen  (PERCOCET/ROXICET) 5-325 MG tablet Take 1 tablet by mouth every 6 (six) hours as needed for severe pain (pain score 7-10). Patient not taking: Reported on 08/09/2024 06/26/24   Laurice Maude BROCKS, MD  propranolol  ER (INDERAL  LA) 60 MG 24 hr capsule Take 60 mg by mouth daily. 04/27/24   [provider]  Semaglutide ,0.25 or 0.5MG /DOS, (OZEMPIC , 0.25 OR 0.5 MG/DOSE,) 2 MG/3ML SOPN Inject 0.5 mg into the skin once a week. 01/27/24   McElwee, Tinnie LABOR, NP    Allergies: Patient has no known allergies.    Review of Systems  Updated Vital Signs BP 131/84 (BP Location: Left Arm)   Pulse 93   Temp (!) 97.4 F (36.3 C)   Resp 17   LMP 09/01/2024 (Exact Date)   SpO2 97%   Physical Exam  (all labs ordered are listed, but only abnormal results  are displayed) Labs Reviewed - No data to display  EKG: None  Radiology: No results found.  {Document cardiac monitor, telemetry assessment procedure when appropriate:32947} Procedures   Medications Ordered in the ED  ketorolac  (TORADOL ) 15 MG/ML injection 15 mg (has no administration in time range)  oxyCODONE  (Oxy IR/ROXICODONE ) immediate release tablet 5 mg (has no administration in time range)      {Click here for ABCD2, HEART and other calculators REFRESH Note before signing:1}                              Medical Decision Making Amount and/or Complexity of Data Reviewed Radiology: ordered.  Risk Prescription drug management.   ***  {Document critical care time when appropriate  Document review of labs and clinical decision tools ie CHADS2VASC2, etc  Document your independent review of radiology images and any outside records  Document your discussion with family members, caretakers and with consultants  Document social determinants of health affecting pt's care  Document your decision making why or why not admission, treatments were needed:32947:::1}   Final diagnoses:  None    ED Discharge Orders     None

## 2024-09-06 NOTE — ED Notes (Signed)
 Reviewed AVS/discharge instruction with patient. Time allotted for and all questions answered. Patient is agreeable for d/c and escorted to ed exit by staff.

## 2024-09-06 NOTE — Discharge Instructions (Signed)
 You were seen for shoulder pain in the emergency department.   At home, please use Tylenol  and ibuprofen . You may also take the oxycodone  we have prescribed you for any breakthrough pain that may have.  Do not take this before driving or operating heavy machinery.  Do not take this medication with alcohol.  Do not wear the shoulder sling for more than 24 hours because it can lead to decreased shoulder mobility and frozen shoulder.  Check your MyChart online for the results of any tests that had not resulted by the time you left the emergency department.   Follow-up with orthopedics in 2 to 3 days.  Return immediately to the emergency department if you experience any of the following: Worsening pain, numbness or weakness of your hand, or any other concerning symptoms.    Thank you for visiting our Emergency Department. It was a pleasure taking care of you today.

## 2024-09-06 NOTE — ED Triage Notes (Signed)
 Right shoulder pain x 2 days denies injury

## 2024-09-10 ENCOUNTER — Other Ambulatory Visit (HOSPITAL_COMMUNITY)
Admission: RE | Admit: 2024-09-10 | Discharge: 2024-09-10 | Disposition: A | Source: Ambulatory Visit | Attending: Obstetrics and Gynecology | Admitting: Obstetrics and Gynecology

## 2024-09-10 ENCOUNTER — Ambulatory Visit: Admitting: Obstetrics and Gynecology

## 2024-09-10 ENCOUNTER — Encounter: Payer: Self-pay | Admitting: Obstetrics and Gynecology

## 2024-09-10 VITALS — BP 110/75 | HR 80 | Ht 63.0 in | Wt 173.0 lb

## 2024-09-10 DIAGNOSIS — Z1151 Encounter for screening for human papillomavirus (HPV): Secondary | ICD-10-CM

## 2024-09-10 DIAGNOSIS — N6019 Diffuse cystic mastopathy of unspecified breast: Secondary | ICD-10-CM | POA: Diagnosis not present

## 2024-09-10 DIAGNOSIS — N9419 Other specified dyspareunia: Secondary | ICD-10-CM | POA: Diagnosis not present

## 2024-09-10 DIAGNOSIS — M7918 Myalgia, other site: Secondary | ICD-10-CM | POA: Diagnosis not present

## 2024-09-10 DIAGNOSIS — N939 Abnormal uterine and vaginal bleeding, unspecified: Secondary | ICD-10-CM | POA: Diagnosis not present

## 2024-09-10 DIAGNOSIS — Z01419 Encounter for gynecological examination (general) (routine) without abnormal findings: Secondary | ICD-10-CM | POA: Diagnosis not present

## 2024-09-10 NOTE — Addendum Note (Signed)
 Addended by: ERIK FELTS on: 09/10/2024 10:08 AM   Modules accepted: Orders

## 2024-09-10 NOTE — Progress Notes (Addendum)
 ANNUAL EXAM Patient name: Emily Glenn MRN [id number]  Date of birth: 1976/05/10 Chief Complaint:   New Patient (Initial Visit)  History of Present Illness:   Emily Glenn is a 48 y.o. [phone] with Patient's last menstrual period was 09/01/2024 (exact date). being seen today for a routine annual exam.  Current complaints:   Saw PCP 1 month ago after an episode of prolonged vaginal bleeding x 2 weeks. I personally reviewed note from PCP 08/09/24, as well as Hgb 11.8 08/09/24, negative pregnancy test 08/09/24, and pelvic US  08/18/24 with possible endometrial polyp and <1cm fibroid.   Also having painful sex. This has been present for years. See my note from June for details of low libido.  Interested in tubal reversal. Regrets getting tubal. She would want more children and is wondering if the surgery is contributing to her low libido and painful intercourse.  She also has breast pain and breast cysts diagnosed by [facility] on 05/13/24. Breast cysts are small and measure ~1cm. She would like to meet with breast surgeon.  Last pap 08/26/2019. Results were: NILM w/ HRHPV negative.  Last mammogram: 05/13/24. Results were: BI-RADS 2 Last colonoscopy: Cologuard negative 04/04/24     09/10/2024    8:33 AM 05/04/2024    9:48 AM 08/27/2023    3:08 PM 05/27/2023    8:34 AM 04/17/2022   10:25 AM  Depression screen PHQ 2/9  Decreased Interest 0 0 0 0 0  Down, Depressed, Hopeless 1 0 0 0 0  PHQ - 2 Score 1 0 0 0 0  Altered sleeping 1 0 2 2 1   Tired, decreased energy 1 1 2 2  0  Change in appetite 0 0 0 0 0  Feeling bad or failure about yourself  0 0 0 0 0  Trouble concentrating 0 0 0 0 0  Moving slowly or fidgety/restless 0 0 0 0 0  Suicidal thoughts 0 0 0 0 0  PHQ-9 Score 3 1  4  4  1    Difficult doing work/chores Not difficult at all Not difficult at all Somewhat difficult  Somewhat difficult     Data saved with a previous flowsheet row definition        09/10/2024    8:34 AM 05/04/2024    9:48 AM  08/27/2023    3:08 PM  GAD 7 : Generalized Anxiety Score  Nervous, Anxious, on Edge 0 0 0  Control/stop worrying 0 0 0  Worry too much - different things 0 0 0  Trouble relaxing 0 2 2  Restless 0 0 0  Easily annoyed or irritable 1 1 1   Afraid - awful might happen 0 0 0  Total GAD 7 Score 1 3 3   Anxiety Difficulty Not difficult at all Somewhat difficult Somewhat difficult     Review of Systems:   Pertinent items are noted in HPI Denies any headaches, blurred vision, fatigue, shortness of breath, chest pain, abdominal pain, abnormal vaginal discharge/itching/odor/irritation, problems with periods, bowel movements, urination, or intercourse unless otherwise stated above. Pertinent History Reviewed:  Reviewed past medical,surgical, social and family history.  Reviewed problem list, medications and allergies. Physical Assessment:   Vitals:   09/10/24 0818  BP: 110/75  Pulse: 80  Weight: 173 lb (78.5 kg)  Height: 5' 3 (1.6 m)  Body mass index is 30.65 kg/m.        Physical Examination:   General appearance - well appearing, and in no distress  Mental status - alert, oriented to  person, place, and time  Chest - respiratory effort normal  Heart - normal peripheral perfusion  Breasts - breasts appear normal, no suspicious masses, no skin or nipple changes or axillary nodes  Abdomen - soft, nontender, nondistended, no masses or organomegaly  Pelvic - VULVA: normal appearing vulva with no masses, tenderness or lesions  VAGINA: normal appearing vagina with normal color and discharge, no lesions +levator & b/l obturator tenderness & spasm CERVIX: normal appearing cervix without discharge or lesions, no CMT  Thin prep pap is done with HR HPV cotesting  UTERUS: uterus is felt to be normal size, shape, consistency and nontender   ADNEXA: No adnexal masses or tenderness noted.  Chaperone present for exam  No results found for this or any previous visit (from the past 24 hours).   Assessment & Plan:  1) Well-Woman Exam Mammogram: in 1 year, or sooner if problems Colonoscopy: cologuard in 3 years, or sooner if problems Pap: Collected GC/CT: collected HIV/HCV: Declined Discussed that I recommend AGAINST tubal reversal. It puts her at unnecessary risk both from a surgical standpoint and pregnancy standpoint. She has a low likelihood of natural conception at 34 AND would be extremely high risk for ectopic pregnancy. Pregnancy at> 40yo is also high risk. She is more likely to have successful live birth with IVF. While both can be cost-prohibitive, IVF is more likely to help her achieve her goals. I also reviewed that tubal would not explain her dyspareunia (myofascial on exam today) or her decreased libido  Myofascial pain Dyspareunia due to medical condition in female -     Ambulatory referral to Physical Therapy  Abnormal uterine bleeding (AUB) Recommended EMB today, she declines due to concerns about pain control Will schedule for hysteroscopy D&C. Reviewed elevated risks with anesthesia and uterine perforation compared to in office EMB, but this will also allow for polypectomy if seen at time of surgery. -     Ambulatory Referral For Surgery Scheduling  Breast pain Discussed MMG findings and strategies for management of breast pain While surgical removal may not be feasible and not currently recommended, she would like to meet with breast surgeon to discuss in detail Referral placed  Labs/procedures today:   Orders Placed This Encounter  Procedures   Ambulatory referral to Physical Therapy   Ambulatory Referral For Surgery Scheduling   Ambulatory Referral for Breast Surgery   Follow-up: hysteroscopy  Emily JAYSON Carolin, MD 09/13/2024 2:51 PM

## 2024-09-10 NOTE — Progress Notes (Addendum)
 48 y.o. New GYN presents for AEX/PAP.  C/o non existent libido, wants reversal of BTS which was done 17+ years ago.    Last Mammogram 05/13/2024.

## 2024-09-10 NOTE — Patient Instructions (Signed)
 [email or web address]

## 2024-09-13 ENCOUNTER — Ambulatory Visit: Payer: Self-pay | Admitting: Obstetrics and Gynecology

## 2024-09-13 LAB — CERVICOVAGINAL ANCILLARY ONLY
Chlamydia: NEGATIVE
Comment: NEGATIVE
Comment: NEGATIVE
Comment: NORMAL
Neisseria Gonorrhea: NEGATIVE
Trichomonas: NEGATIVE

## 2024-09-13 LAB — CYTOLOGY - PAP
Comment: NEGATIVE
Diagnosis: NEGATIVE
High risk HPV: NEGATIVE

## 2024-09-21 ENCOUNTER — Telehealth: Payer: Self-pay

## 2024-09-22 ENCOUNTER — Telehealth: Payer: Self-pay

## 2024-09-22 NOTE — Telephone Encounter (Signed)
 Called patient's husband Emily Glenn. Patient and I had previously discussed that he may have questions and she gave her consent to disclose what we talked about during her appointment on 11/14.   Reviewed diagnosis of AUB & possible endometrial polyp. Discussed plan for hysteroscopy D&C to allow for sampling and remove any polyps seen. He also asked if this could be causing issues with sexual activity. We discussed exam findings of pelvic floor muscle spasm which can make intercourse painful and we are planning PFPT to help with those concerns. No further questions.  Kieth Carolin, MD Obstetrician & Gynecologist, [facility] for [employer], [facility]

## 2024-09-22 NOTE — Telephone Encounter (Signed)
 I called patient to see if she's available for surgery on 12/2 w/ Dr. Erik. I left a voicemail requesting a call back.

## 2024-09-29 ENCOUNTER — Telehealth: Payer: Self-pay

## 2024-09-29 NOTE — Telephone Encounter (Signed)
 I called patient to see if she's available for surgery on 10/25/24. I left a voicemail requesting a call back.

## 2024-10-14 ENCOUNTER — Encounter (HOSPITAL_COMMUNITY): Payer: Self-pay | Admitting: Obstetrics and Gynecology

## 2024-10-14 NOTE — Progress Notes (Signed)
 Spoke w/ via phone for pre-op interview--- [facility] needs dos---- A1C, CBG, BMP and EKG per anesthesia.        Lab results------ COVID test -----patient states asymptomatic no test needed Arrive at -------1100 NPO after MN NO Solid Food.  Clear liquids from MN until---1000 Pre-Surgery Ensure or G2:  Med rec completed Medications to take morning of surgery -----Propranolol  and Gabapentin  Diabetic medication -----  GLP1 agonist last dose: Ozemoic weekly. Last dose 10/11/24. GLP1 instructions: Hold any further doses until after surgery.  Patient instructed no nail polish to be worn day of surgery Patient instructed to bring photo id and insurance card day of surgery Patient aware to have Driver (ride ) / caregiver    for 24 hours after surgery - Husband Camellia Seip Patient Special Instructions ----- Pre-Op special Instructions -----  Patient verbalized understanding of instructions that were given at this phone interview. Patient denies chest pain, sob, fever, cough at the interview.

## 2024-10-18 ENCOUNTER — Other Ambulatory Visit: Payer: Self-pay | Admitting: Obstetrics and Gynecology

## 2024-10-18 DIAGNOSIS — N840 Polyp of corpus uteri: Secondary | ICD-10-CM

## 2024-10-18 DIAGNOSIS — N938 Other specified abnormal uterine and vaginal bleeding: Secondary | ICD-10-CM

## 2024-10-18 NOTE — Progress Notes (Signed)
OR orders placed.

## 2024-10-25 ENCOUNTER — Encounter (HOSPITAL_COMMUNITY): Payer: Self-pay | Admitting: Obstetrics and Gynecology

## 2024-10-25 ENCOUNTER — Encounter: Payer: Self-pay | Admitting: Obstetrics and Gynecology

## 2024-10-26 ENCOUNTER — Ambulatory Visit (HOSPITAL_COMMUNITY)
Admission: RE | Admit: 2024-10-26 | Discharge: 2024-10-26 | Disposition: A | Discharge status: Home / Self Care | Attending: Obstetrics and Gynecology | Admitting: Obstetrics and Gynecology

## 2024-10-26 ENCOUNTER — Encounter (HOSPITAL_COMMUNITY)
Admission: RE | Disposition: A | Discharge status: Home / Self Care | Payer: Self-pay | Source: Home / Self Care | Attending: Obstetrics and Gynecology

## 2024-10-26 ENCOUNTER — Ambulatory Visit (HOSPITAL_COMMUNITY): Admitting: Anesthesiology

## 2024-10-26 ENCOUNTER — Other Ambulatory Visit: Payer: Self-pay

## 2024-10-26 ENCOUNTER — Encounter (HOSPITAL_COMMUNITY): Payer: Self-pay | Admitting: Obstetrics and Gynecology

## 2024-10-26 DIAGNOSIS — E119 Type 2 diabetes mellitus without complications: Secondary | ICD-10-CM | POA: Diagnosis not present

## 2024-10-26 DIAGNOSIS — N939 Abnormal uterine and vaginal bleeding, unspecified: Secondary | ICD-10-CM | POA: Diagnosis not present

## 2024-10-26 DIAGNOSIS — Z7984 Long term (current) use of oral hypoglycemic drugs: Secondary | ICD-10-CM

## 2024-10-26 DIAGNOSIS — N938 Other specified abnormal uterine and vaginal bleeding: Secondary | ICD-10-CM | POA: Diagnosis present

## 2024-10-26 DIAGNOSIS — N840 Polyp of corpus uteri: Secondary | ICD-10-CM | POA: Diagnosis present

## 2024-10-26 DIAGNOSIS — J45909 Unspecified asthma, uncomplicated: Secondary | ICD-10-CM | POA: Insufficient documentation

## 2024-10-26 HISTORY — PX: HYSTEROSCOPY WITH D & C: SHX1775

## 2024-10-26 LAB — BASIC METABOLIC PANEL WITH GFR
Anion gap: 10 (ref 5–15)
BUN: 7 mg/dL (ref 6–20)
CO2: 24 mmol/L (ref 22–32)
Calcium: 9.1 mg/dL (ref 8.9–10.3)
Chloride: 106 mmol/L (ref 98–111)
Creatinine, Ser: 0.97 mg/dL (ref 0.44–1.00)
GFR, Estimated: >60 mL/min
Glucose, Bld: 125 mg/dL — ABNORMAL HIGH (ref 70–99)
Potassium: 4 mmol/L (ref 3.5–5.1)
Sodium: 141 mmol/L (ref 135–145)

## 2024-10-26 LAB — HEMOGLOBIN A1C
Hgb A1c MFr Bld: 6.2 % — ABNORMAL HIGH (ref 4.8–5.6)
Mean Plasma Glucose: 131.24 mg/dL

## 2024-10-26 LAB — POCT PREGNANCY, URINE: Preg Test, Ur: NEGATIVE

## 2024-10-26 LAB — GLUCOSE, CAPILLARY
Glucose-Capillary: 102 mg/dL — ABNORMAL HIGH (ref 70–99)
Glucose-Capillary: 129 mg/dL — ABNORMAL HIGH (ref 70–99)

## 2024-10-26 SURGERY — DILATATION AND CURETTAGE /HYSTEROSCOPY
Anesthesia: General | Site: Uterus

## 2024-10-26 MED ORDER — OXYCODONE HCL 5 MG PO TABS: 5.0000 mg | ORAL_TABLET | Freq: Once | ORAL | Status: DC | PRN

## 2024-10-26 MED ORDER — ORAL CARE MOUTH RINSE: 15.0000 mL | Freq: Once | OROMUCOSAL | Status: AC

## 2024-10-26 MED ORDER — OXYCODONE HCL 5 MG/5ML PO SOLN: 5.0000 mg | Freq: Once | ORAL | Status: DC | PRN

## 2024-10-26 MED ORDER — IBUPROFEN 600 MG PO TABS: 600.0000 mg | ORAL_TABLET | Freq: Four times a day (QID) | ORAL | 0 refills | Status: AC | PRN

## 2024-10-26 MED ORDER — AMISULPRIDE (ANTIEMETIC) 5 MG/2ML IV SOLN
10.0000 mg | Freq: Once | INTRAVENOUS | Status: AC | PRN
  Administered 2024-10-26: 10 mg via INTRAVENOUS

## 2024-10-26 MED ORDER — SODIUM CHLORIDE 0.9 % IR SOLN
Status: DC | PRN
  Administered 2024-10-26: 3000 mL

## 2024-10-26 MED ORDER — HYDROMORPHONE HCL 1 MG/ML IJ SOLN
0.2500 mg | INTRAMUSCULAR | Status: DC | PRN
  Administered 2024-10-26 (×2): 0.5 mg via INTRAVENOUS

## 2024-10-26 MED ORDER — LACTATED RINGERS IV SOLN: INTRAVENOUS | Status: DC

## 2024-10-26 MED ORDER — ACETAMINOPHEN 500 MG PO TABS: 1000.0000 mg | ORAL_TABLET | Freq: Four times a day (QID) | ORAL | 0 refills | Status: AC | PRN

## 2024-10-26 MED ORDER — LIDOCAINE 2% (20 MG/ML) 5 ML SYRINGE
INTRAMUSCULAR | Status: AC
  Filled 2024-10-26: qty 5

## 2024-10-26 MED ORDER — LIDOCAINE 2% (20 MG/ML) 5 ML SYRINGE
INTRAMUSCULAR | Status: DC | PRN
  Administered 2024-10-26: 80 mg via INTRAVENOUS

## 2024-10-26 MED ORDER — PROPOFOL 500 MG/50ML IV EMUL
INTRAVENOUS | Status: DC | PRN
  Administered 2024-10-26: 100 ug/kg/min via INTRAVENOUS
  Administered 2024-10-26: 125 ug/kg/min via INTRAVENOUS

## 2024-10-26 MED ORDER — AMISULPRIDE (ANTIEMETIC) 5 MG/2ML IV SOLN
INTRAVENOUS | Status: AC
  Filled 2024-10-26: qty 2

## 2024-10-26 MED ORDER — ONDANSETRON HCL 4 MG/2ML IJ SOLN
INTRAMUSCULAR | Status: AC
  Filled 2024-10-26: qty 2

## 2024-10-26 MED ORDER — FENTANYL CITRATE (PF) 100 MCG/2ML IJ SOLN
INTRAMUSCULAR | Status: AC
  Filled 2024-10-26: qty 2

## 2024-10-26 MED ORDER — HYDROMORPHONE HCL 1 MG/ML IJ SOLN
INTRAMUSCULAR | Status: AC
  Filled 2024-10-26: qty 1

## 2024-10-26 MED ORDER — PHENYLEPHRINE 80 MCG/ML (10ML) SYRINGE FOR IV PUSH (FOR BLOOD PRESSURE SUPPORT)
PREFILLED_SYRINGE | INTRAVENOUS | Status: DC | PRN
  Administered 2024-10-26: 80 ug via INTRAVENOUS

## 2024-10-26 MED ORDER — PROPOFOL 10 MG/ML IV BOLUS
INTRAVENOUS | Status: DC | PRN
  Administered 2024-10-26: 200 mg via INTRAVENOUS

## 2024-10-26 MED ORDER — MEPERIDINE HCL 25 MG/ML IJ SOLN: 6.2500 mg | INTRAMUSCULAR | Status: DC | PRN

## 2024-10-26 MED ORDER — CHLORHEXIDINE GLUCONATE 0.12 % MT SOLN
15.0000 mL | Freq: Once | OROMUCOSAL | Status: AC
  Administered 2024-10-26: 15 mL via OROMUCOSAL

## 2024-10-26 MED ORDER — MIDAZOLAM HCL 2 MG/2ML IJ SOLN
INTRAMUSCULAR | Status: AC
  Filled 2024-10-26: qty 2

## 2024-10-26 MED ORDER — CHLORHEXIDINE GLUCONATE 0.12 % MT SOLN
OROMUCOSAL | Status: AC
  Filled 2024-10-26: qty 15

## 2024-10-26 MED ORDER — ONDANSETRON HCL 4 MG/2ML IJ SOLN
INTRAMUSCULAR | Status: DC | PRN
  Administered 2024-10-26: 4 mg via INTRAVENOUS

## 2024-10-26 MED ORDER — DEXAMETHASONE SOD PHOSPHATE PF 10 MG/ML IJ SOLN
INTRAMUSCULAR | Status: DC | PRN
  Administered 2024-10-26: 10 mg via INTRAVENOUS

## 2024-10-26 MED ORDER — MIDAZOLAM HCL (PF) 2 MG/2ML IJ SOLN
INTRAMUSCULAR | Status: DC | PRN
  Administered 2024-10-26: 2 mg via INTRAVENOUS

## 2024-10-26 SURGICAL SUPPLY — 11 items
CATH ROBINSON RED A/P 16FR (CATHETERS) ×1 IMPLANT
COVER MAYO STAND STRL (DRAPES) ×1 IMPLANT
ELECTRODE REM PT RTRN 9FT ADLT (ELECTROSURGICAL) IMPLANT
GLOVE BIO SURGEON STRL SZ7.5 (GLOVE) ×1 IMPLANT
GLOVE SURG UNDER POLY LF SZ7 (GLOVE) ×1 IMPLANT
GOWN STRL REUS W/ TWL LRG LVL3 (GOWN DISPOSABLE) ×1 IMPLANT
GOWN STRL REUS W/ TWL XL LVL3 (GOWN DISPOSABLE) ×1 IMPLANT
KIT PROCED FLUENT PRO FLT212S (KITS) ×1 IMPLANT
PACK VAGINAL MINOR WOMEN LF (CUSTOM PROCEDURE TRAY) ×1 IMPLANT
PAD OB MATERNITY 11 LF (PERSONAL CARE ITEMS) ×1 IMPLANT
TOWEL GREEN STERILE FF (TOWEL DISPOSABLE) ×2 IMPLANT

## 2024-10-26 NOTE — Transfer of Care (Signed)
 Immediate Anesthesia Transfer of Care Note  Patient: Emily Glenn  Procedure(s) Performed: DILATATION AND CURETTAGE /HYSTEROSCOPY (Uterus)  Patient Location: PACU  Anesthesia Type:General  Level of Consciousness: drowsy  Airway & Oxygen Therapy: Patient Spontanous Breathing and Patient connected to face mask oxygen  Post-op Assessment: Report given to RN and Post -op Vital signs reviewed and stable  Post vital signs: Reviewed and stable  Last Vitals:  Vitals Value Taken Time  BP 139/82 10/26/24 13:05  Temp 36.6 C 10/26/24 13:05  Pulse 79 10/26/24 13:06  Resp 26 10/26/24 13:06  SpO2 100 % 10/26/24 13:06  Vitals shown include unfiled device data.  Last Pain:  Vitals:   10/26/24 1038  TempSrc: Oral  PainSc: 0-No pain      Patients Stated Pain Goal: 4 (10/26/24 1038)  Complications: No notable events documented.

## 2024-10-26 NOTE — H&P (Signed)
 "  PRE OPERATIVE HISTORY AND PHYSICAL   Subjective:  Emily Glenn is a 48 y.o. [phone] with AUB & possible endometrial polyp presenting for hysteroscopy D&C  Prolonged episode of bleeding in October. UPT negative. Normal pap/GC/CT/trich testing. Pelvic US  08/18/24 with possible endometrial polyp and possible <1cm fibroid posteriorly. Recommended endometrial biopsy for bleeding >8 days and age > 27 but she declined due to concerns about pain control and opted to proceed with hysteroscopy D&C.   No complaints today.   Past Medical History:  Diagnosis Date   Anemia    Asthma    Diabetes mellitus without complication (HCC)    Elevated serum creatinine 04/14/2023   Migraine    Preterm labor    Refusal of blood transfusions as patient is Jehovah's Witness    Vaginal Pap smear, abnormal    Viral URI 04/18/2022   Past Surgical History:  Procedure Laterality Date   TUBAL LIGATION     14 years ago   Medications Ordered Prior to Encounter[1] Allergies[2] OB History     Gravida  4   Para  3   Term  0   Preterm  3   AB  0   Living  3      SAB  0   IAB  0   Ectopic  0   Multiple  0   Live Births  3          Social History   Socioeconomic History   Marital status: Married    Spouse name: Not on file   Number of children: 3   Years of education: Not on file   Highest education level: Bachelor's degree (e.g., BA, AB, BS)  Occupational History   Occupation: [employer]  Tobacco Use   Smoking status: Never   Smokeless tobacco: Never  Vaping Use   Vaping status: Never Used  Substance and Sexual Activity   Alcohol use: Yes    Comment: wine occasionally   Drug use: No   Sexual activity: Yes    Birth control/protection: Surgical  Other Topics Concern   Not on file  Social History Narrative   Not on file   Social Drivers of Health   Tobacco Use: Low Risk (10/25/2024)   Patient History    Smoking Tobacco Use: Never    Smokeless Tobacco Use: Never    Passive  Exposure: Not on file  Financial Resource Strain: Low Risk (02/24/2024)   Overall Financial Resource Strain (CARDIA)    Difficulty of Paying Living Expenses: Not very hard  Food Insecurity: Food Insecurity Present (02/24/2024)   Hunger Vital Sign    Worried About Running Out of Food in the Last Year: Never true    Ran Out of Food in the Last Year: Sometimes true  Transportation Needs: No Transportation Needs (02/24/2024)   PRAPARE - Administrator, Civil Service (Medical): No    Lack of Transportation (Non-Medical): No  Physical Activity: Insufficiently Active (02/24/2024)   Exercise Vital Sign    Days of Exercise per Week: 1 day    Minutes of Exercise per Session: 60 min  Stress: No Stress Concern Present (02/24/2024)   [employer] of Occupational Health - Occupational Stress Questionnaire    Feeling of Stress : Not at all  Social Connections: Socially Integrated (02/24/2024)   Social Connection and Isolation Panel    Frequency of Communication with Friends and Family: More than three times a week    Frequency of Social Gatherings with Friends  and Family: More than three times a week    Attends Religious Services: More than 4 times per year    Active Member of Clubs or Organizations: Yes    Attends Banker Meetings: More than 4 times per year    Marital Status: Married  Catering Manager Violence: Not on file  Depression (PHQ2-9): Low Risk (09/10/2024)   Depression (PHQ2-9)    PHQ-2 Score: 3  Alcohol Screen: Low Risk (02/24/2024)   Alcohol Screen    Last Alcohol Screening Score (AUDIT): 1  Housing: Low Risk (02/24/2024)   Housing Stability Vital Sign    Unable to Pay for Housing in the Last Year: No    Number of Times Moved in the Last Year: 0    Homeless in the Last Year: No  Utilities: Not on file  Health Literacy: Not on file   Objective:   Vitals:   10/14/24 1422  Weight: 78.5 kg   General:  Alert, oriented and cooperative. Patient is in no  acute distress.  Skin: Skin is warm and dry. No rash noted.   Cardiovascular: Normal heart rate noted  Respiratory: Normal respiratory effort, no problems with respiration noted   Assessment and Plan:  Emily Glenn is a 49 y.o. with AUB/possible endometrial polyp presenting for scheduled hysteroscopy D&C  - Diagnosis: Abnormal uterine bleeding, possible endometrial polyp - Planned surgery: Hysteroscopy dilation & curettage, possible polypectomy  - Risks of surgery include but are not limited to: bleeding, infection, injury to surrounding organs/tissues (i.e. bowel/bladder/ureters), need for additional procedures, wound complications, hospital re-admission, VTE, additional complications: Uterine perforation - We discussed postop restrictions, precautions and expectations - Preop testing needed: per anesthesia - All questions answered - No pre op abx indicated, to OR when ready  Kieth JAYSON Carolin, MD      [1]  No current facility-administered medications on file prior to encounter.   Current Outpatient Medications on File Prior to Encounter  Medication Sig Dispense Refill   azaTHIOprine (IMURAN) 50 MG tablet Take 50 mg by mouth daily.     Blood Glucose Monitoring Suppl DEVI 1 each by Does not apply route daily at 6 (six) AM. May substitute to any manufacturer covered by patient's insurance. 1 each 0   DUREZOL 0.05 % EMUL Apply 1 drop to eye daily.     gabapentin  (NEURONTIN ) 300 MG capsule Take by mouth.     oxyCODONE  (ROXICODONE ) 5 MG immediate release tablet Take 1 tablet (5 mg total) by mouth every 4 (four) hours as needed. 15 tablet 0   oxyCODONE -acetaminophen  (PERCOCET/ROXICET) 5-325 MG tablet Take 1 tablet by mouth every 6 (six) hours as needed for severe pain (pain score 7-10). (Patient not taking: Reported on 08/09/2024) 15 tablet 0   propranolol  ER (INDERAL  LA) 60 MG 24 hr capsule Take 60 mg by mouth daily.     Semaglutide ,0.25 or 0.5MG /DOS, (OZEMPIC , 0.25 OR 0.5 MG/DOSE,) 2  MG/3ML SOPN Inject 0.5 mg into the skin once a week. 3 mL 1  [2] No Known Allergies  "

## 2024-10-26 NOTE — Discharge Instructions (Addendum)
 Post-surgical Instructions, Outpatient Surgery  You may expect to feel dizzy, weak, and drowsy for as long as 24 hours after receiving the medicine that made you sleep (anesthetic). For the first 24 hours after your surgery:   Do not drive a car, ride a bicycle, participate in physical activities, or take public transportation  Do not drink alcohol or take tranquilizers.  Do not take medicine that has not been prescribed by your physicians.  Do not sign important papers or make important decisions while on narcotic pain medicines.  Have a responsible person with you.   PAIN MANAGEMENT Ibuprofen  600mg .  (This is the same as 3-200mg  over the counter tablets of Motrin  or ibuprofen .)  Take this every 6 hours or as needed for cramping.   Acetaminophen  1000mg  (This is the same as 2-500mg  over the counter extra strength tylenol ). Take this every 6 hours for the first 3 days or as needed afterwards for pain  DO'S AND DON'T'S Do move around as you feel able.  Stairs are fine.  You may begin to exercise again as you feel able.  Do not put anything in the vagina for two weeks--no tampons, intercourse, or douching.    REGULAR MEDIATIONS/VITAMINS: You may restart all of your regular medications as prescribed. You may restart all of your vitamins as you normally take them.    PLEASE CALL OR SEEK MEDICAL CARE IF: You have persistent nausea and vomiting.  You have trouble eating or drinking.  You have an oral temperature above 100.5.  You have constipation that is not helped by adjusting diet or increasing fluid intake. Pain medicines are a common cause of constipation.  You have heavy vaginal bleeding  Post Anesthesia Home Care Instructions  Activity: Get plenty of rest for the remainder of the day. A responsible adult should stay with you for 24 hours following the procedure.  For the next 24 hours, DO NOT: -Drive a car -Advertising copywriter -Drink alcoholic beverages -Take any medication unless  instructed by your physician -Make any legal decisions or sign important papers.  Meals: Start with liquid foods such as gelatin or soup. Progress to regular foods as tolerated. Avoid greasy, spicy, heavy foods. If nausea and/or vomiting occur, drink only clear liquids until the nausea and/or vomiting subsides. Call your physician if vomiting continues.  Special Instructions/Symptoms: Your throat may feel dry or sore from the anesthesia or the breathing tube placed in your throat during surgery. If this causes discomfort, gargle with warm salt water. The discomfort should disappear within 24 hours.

## 2024-10-26 NOTE — Anesthesia Procedure Notes (Signed)
 Procedure Name: LMA Insertion Date/Time: 10/26/2024 12:38 PM  Performed by: Erlene Powell POUR, CRNAPre-anesthesia Checklist: Patient identified, Emergency Drugs available, Suction available and Patient being monitored Patient Re-evaluated:Patient Re-evaluated prior to induction Oxygen Delivery Method: Circle System Utilized Preoxygenation: Pre-oxygenation with 100% oxygen Induction Type: IV induction Ventilation: Mask ventilation without difficulty LMA: LMA inserted LMA Size: 4.0 Number of attempts: 1 Placement Confirmation: positive ETCO2 Tube secured with: Tape Dental Injury: Teeth and Oropharynx as per pre-operative assessment

## 2024-10-26 NOTE — Anesthesia Preprocedure Evaluation (Signed)
"                                    Anesthesia Evaluation  Patient identified by MRN, date of birth, ID band Patient awake    Reviewed: Allergy & Precautions, H&P , NPO status , Patient's Chart, lab work & pertinent test results  Airway Mallampati: II  TM Distance: >3 FB Neck ROM: Full    Dental  (+) Dental Advisory Given   Pulmonary asthma    Pulmonary exam normal breath sounds clear to auscultation       Cardiovascular negative cardio ROS Normal cardiovascular exam Rhythm:Regular Rate:Normal     Neuro/Psych  Headaches  negative psych ROS   GI/Hepatic negative GI ROS, Neg liver ROS,,,  Endo/Other  negative endocrine ROSdiabetes    Renal/GU negative Renal ROS  negative genitourinary   Musculoskeletal negative musculoskeletal ROS (+)    Abdominal   Peds negative pediatric ROS (+)  Hematology negative hematology ROS (+)   Anesthesia Other Findings   Reproductive/Obstetrics negative OB ROS                              Anesthesia Physical Anesthesia Plan  ASA: 3  Anesthesia Plan: General   Post-op Pain Management:    Induction: Intravenous  PONV Risk Score and Plan: 3 and Ondansetron , Dexamethasone , Midazolam  and Treatment may vary due to age or medical condition  Airway Management Planned: LMA  Additional Equipment:   Intra-op Plan:   Post-operative Plan: Extubation in OR  Informed Consent: I have reviewed the patients History and Physical, chart, labs and discussed the procedure including the risks, benefits and alternatives for the proposed anesthesia with the patient or authorized representative who has indicated his/her understanding and acceptance.     Dental advisory given  Plan Discussed with: CRNA  Anesthesia Plan Comments:         Anesthesia Quick Evaluation  "

## 2024-10-26 NOTE — Op Note (Signed)
 10/26/2024 12:59 PM  Preoperative Diagnosis: Abnormal uterine bleeding, possible endometrial polyp Postoperative Diagnosis: Same Procedure: Hysteroscopy dilation & curettage  Surgeon: Dr. Kieth Carolin Assistant: None EBL 5 cc IVF: Per anesthesia UOP: Not collected Deficit: Minimal  Specimens: Endometrial curettings  Findings: Thin normal appearing endometrium. Possible endocervical polyp that was easily removed with sharp curettage. Normal size uterus, mobile. Normal cervix.   Description of the procedure: Preop antibiotics not indicated. Informed consent reviewed and signed. Pt given opportunity to ask questions.   Pt prepped and draped in the dorsal lithotomy fashion after LMA anesthesia found to be adequate. Timeout performed.   Open sided speculum placed into the patient's vagina. Single tooth tenaculum applied to the 12 o'clock position of the cervix. Cervix progressively dilated to a 17 French. 30 degree hysteroscope was inserted into the cavity with the aforementioned findings noted. EMC performed. Repeat hysteroscopic exam confirmed thorough sampling and intact uterine cavity  Hemostatic at the end of the procedure. Procedure completed. All instruments removed. Counts correct x2.  Pt taken to recovery room in stable condition.  Kieth Carolin, MD Obstetrician & Gynecologist, [facility] for [employer], [facility]

## 2024-10-26 NOTE — Anesthesia Postprocedure Evaluation (Signed)
"   Anesthesia Post Note  Patient: Rayli Wiederhold  Procedure(s) Performed: DILATATION AND CURETTAGE /HYSTEROSCOPY (Uterus)     Patient location during evaluation: PACU Anesthesia Type: General Level of consciousness: awake and alert Pain management: pain level controlled Vital Signs Assessment: post-procedure vital signs reviewed and stable Respiratory status: spontaneous breathing, nonlabored ventilation and respiratory function stable Cardiovascular status: blood pressure returned to baseline and stable Postop Assessment: no apparent nausea or vomiting Anesthetic complications: no   No notable events documented.  Last Vitals:  Vitals:   10/26/24 1305 10/26/24 1331  BP: 139/82   Pulse:    Resp: (!) 33 12  Temp: 36.6 C   SpO2:      Last Pain:  Vitals:   10/26/24 1330  TempSrc:   PainSc: 4                  Butler Levander Pinal      "

## 2024-10-27 ENCOUNTER — Ambulatory Visit: Payer: Self-pay | Admitting: Obstetrics and Gynecology

## 2024-10-27 ENCOUNTER — Encounter (HOSPITAL_COMMUNITY): Payer: Self-pay | Admitting: Obstetrics and Gynecology

## 2024-10-27 LAB — SURGICAL PATHOLOGY

## 2024-11-17 ENCOUNTER — Ambulatory Visit: Attending: Obstetrics and Gynecology | Admitting: Physical Therapy

## 2024-11-30 ENCOUNTER — Encounter: Payer: Self-pay | Admitting: Obstetrics and Gynecology
